# Patient Record
Sex: Female | Born: 1965 | Race: White | Hispanic: No | Marital: Single | Smoking: Never smoker
Health system: Southern US, Community
[De-identification: ages and names within clinical notes are randomized; demographics above are authoritative.]

## PROBLEM LIST (undated history)

## (undated) DIAGNOSIS — F419 Anxiety disorder, unspecified: Secondary | ICD-10-CM

## (undated) DIAGNOSIS — E785 Hyperlipidemia, unspecified: Secondary | ICD-10-CM

## (undated) DIAGNOSIS — E119 Type 2 diabetes mellitus without complications: Secondary | ICD-10-CM

## (undated) DIAGNOSIS — G4730 Sleep apnea, unspecified: Secondary | ICD-10-CM

## (undated) DIAGNOSIS — J309 Allergic rhinitis, unspecified: Secondary | ICD-10-CM

## (undated) HISTORY — DX: Anxiety disorder, unspecified: F41.9

## (undated) HISTORY — DX: Sleep apnea, unspecified: G47.30

## (undated) HISTORY — DX: Allergic rhinitis, unspecified: J30.9

## (undated) HISTORY — DX: Type 2 diabetes mellitus without complications: E11.9

## (undated) HISTORY — DX: Hyperlipidemia, unspecified: E78.5

---

## 2006-11-19 ENCOUNTER — Ambulatory Visit: Payer: Self-pay | Admitting: Family Medicine

## 2009-01-05 ENCOUNTER — Emergency Department: Payer: Self-pay | Admitting: Emergency Medicine

## 2009-08-12 ENCOUNTER — Ambulatory Visit: Payer: Self-pay | Admitting: Internal Medicine

## 2009-11-22 ENCOUNTER — Emergency Department: Payer: Self-pay | Admitting: Emergency Medicine

## 2009-12-01 ENCOUNTER — Ambulatory Visit: Payer: Self-pay | Admitting: Internal Medicine

## 2010-04-19 ENCOUNTER — Ambulatory Visit: Payer: Self-pay | Admitting: Internal Medicine

## 2014-03-18 ENCOUNTER — Ambulatory Visit: Payer: Self-pay

## 2014-05-14 ENCOUNTER — Emergency Department: Payer: Self-pay | Admitting: Emergency Medicine

## 2014-05-14 LAB — CBC
HCT: 41.7 % (ref 35.0–47.0)
HGB: 13.6 g/dL (ref 12.0–16.0)
MCH: 31 pg (ref 26.0–34.0)
MCHC: 32.7 g/dL (ref 32.0–36.0)
MCV: 95 fL (ref 80–100)
PLATELETS: 226 10*3/uL (ref 150–440)
RBC: 4.39 10*6/uL (ref 3.80–5.20)
RDW: 12.6 % (ref 11.5–14.5)
WBC: 8.2 10*3/uL (ref 3.6–11.0)

## 2014-05-14 LAB — BASIC METABOLIC PANEL
ANION GAP: 10 (ref 7–16)
BUN: 15 mg/dL (ref 7–18)
CREATININE: 0.91 mg/dL (ref 0.60–1.30)
Calcium, Total: 8.7 mg/dL (ref 8.5–10.1)
Chloride: 107 mmol/L (ref 98–107)
Co2: 24 mmol/L (ref 21–32)
EGFR (African American): >60
Glucose: 90 mg/dL (ref 65–99)
Osmolality: 282 (ref 275–301)
POTASSIUM: 3.7 mmol/L (ref 3.5–5.1)
SODIUM: 141 mmol/L (ref 136–145)

## 2014-05-14 LAB — TROPONIN I

## 2014-10-01 ENCOUNTER — Ambulatory Visit: Payer: Self-pay | Admitting: Physician Assistant

## 2016-02-21 ENCOUNTER — Other Ambulatory Visit: Payer: Self-pay | Admitting: Nurse Practitioner

## 2016-02-21 DIAGNOSIS — Z1231 Encounter for screening mammogram for malignant neoplasm of breast: Secondary | ICD-10-CM

## 2016-03-07 ENCOUNTER — Ambulatory Visit: Payer: Self-pay

## 2016-03-28 ENCOUNTER — Other Ambulatory Visit: Payer: Self-pay | Admitting: Nurse Practitioner

## 2016-03-28 ENCOUNTER — Ambulatory Visit
Admission: RE | Admit: 2016-03-28 | Discharge: 2016-03-28 | Disposition: A | Discharge status: Home / Self Care | Payer: BC Managed Care – PPO | Source: Ambulatory Visit | Attending: Nurse Practitioner | Admitting: Nurse Practitioner

## 2016-03-28 DIAGNOSIS — Z1231 Encounter for screening mammogram for malignant neoplasm of breast: Secondary | ICD-10-CM | POA: Diagnosis not present

## 2016-05-03 ENCOUNTER — Ambulatory Visit: Payer: BC Managed Care – PPO | Attending: Nurse Practitioner | Admitting: Physical Therapy

## 2016-05-03 ENCOUNTER — Encounter: Payer: Self-pay | Admitting: Physical Therapy

## 2016-05-03 DIAGNOSIS — M62830 Muscle spasm of back: Secondary | ICD-10-CM | POA: Diagnosis present

## 2016-05-03 DIAGNOSIS — R293 Abnormal posture: Secondary | ICD-10-CM | POA: Diagnosis not present

## 2016-05-03 DIAGNOSIS — M5441 Lumbago with sciatica, right side: Secondary | ICD-10-CM | POA: Insufficient documentation

## 2016-05-03 NOTE — Therapy (Signed)
[facility] [address] [place], [place], [address] Phone: [phone]   Fax:  [phone]  Physical Therapy Evaluation  Patient Details  Name: Rebecca Henry MRN: [id number] Date of Birth: Dec 24, 1965 Referring Provider: Leretha Pol PA  Encounter Date: 05/03/2016      PT End of Session - 05/03/16 1810    Visit Number 1   Number of Visits 8   Date for PT Re-Evaluation 05/30/16   PT Start Time 1030   PT Stop Time 1119   PT Time Calculation (min) 49 min   Activity Tolerance Patient tolerated treatment well;Patient limited by pain   Behavior During Therapy [facility] for tasks assessed/performed      History reviewed. No pertinent past medical history.  History reviewed. No pertinent surgical history.  There were no vitals filed for this visit.       Subjective Assessment - 05/03/16 1804    Subjective Pt states that she has a chronic history of low back pain starting in 2003 after she gave birth to her daughter. Reports that the pain didn't get too bad until spring of this year. She saw a chiropractor and used electrostim on her back which along with 1 month rest period gave her relief from low back pain symptoms. 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 Her cafeteria job requires constant standing, regular sweeping, lifting boxes and washing dishes. She is currently out of work secondary to low back pain until October 9th. In order to return to work she must be able to perform the following exercises without pain: lift 10-50 lbs, stoop and bend, push or pull carts with load weight 50-100 lbs, overhead reaching to retrieve case weighing 10-30lbs. Pt denies N/T or issues with bowel/bladder.   Pertinent History Pt with chronic hx of LBP starting iin 2003. Latest episode  involves aggravation of LBP/leg pain with lifting/bending/forward leaning tasks with onset at 9/22. Pt currently out of work with plans to return to work Oct 9th if sx are resolved.   Diagnostic tests x-ray at chiropractor; pt reports she was told there was "limited space/compression" in lower lumbar region.   Patient Stated Goals pt would like to be pain free in her back and legs so that she can return to work.   Currently in Pain? Yes   Pain Score 3    Pain Location Back   Pain Orientation Lower   Pain Descriptors / Indicators Constant   Pain Onset 1 to 4 weeks ago   Aggravating Factors  lifting, bending, forward reaching            [facility] PT Assessment - 05/03/16 0001      Assessment   Medical Diagnosis Low Back Pain   Referring Provider Leretha Pol PA   Onset Date/Surgical Date 04/27/16   Prior Therapy no PT; prior chiropractic care for current episode      Objective:  Manual tx: MET of right piriformis in hooklying 5 sec contract/relax with increased hip ER following tx.   Therapeutic Exercise: Seated piriformis stretch 1 minute x2 ea. Hooklying piriformis stretch - pt with difficulty obtaining/sustaining required position. Cat/camel 10 sec holds x5. Lumbar flexion with white theraball x5 with L opening. Lumbar lateral flexion L opening.   Pt response for medical necessity: Pt presents  with chronic history of low back pain exacerbated by work demands including lifting, bending, forward reaching, etc. She demonstrates functional mobility but shows preference for anterior pelvic tilt with onset of LBP associated with posterior tilt. She experiences 1 episode of acute LBP during PT eval with ER of L hip; pt demonstrates moderate distress - symptoms resolve within 1 minute and she is able to resume evaluation. Pt shows better tolerance of self stretch for L piriformis in sitting.        PT Education - 05/03/16 1810    Education provided Yes   Education Details HEP: piriformis  stretch, cat camel, lateral flexion on theraball, lumbar flexion in sitting with theraball   Person(s) Educated Patient   Methods Explanation;Demonstration;Handout   Comprehension Verbalized understanding;Returned demonstration             PT Long Term Goals - 05/03/16 1826      PT LONG TERM GOAL #1   Title Pt will score <25% on Modified Oswestry to promote increase in self percieved functional mobility so she can tolerate work demands   Baseline 05/03/16: 36%   Time 4   Period Weeks   Status New     PT LONG TERM GOAL #2   Title Pt will perform functional lift with >25lbs with <2/10 pain to promote tolerance of work related tasks   Baseline pt is reluctant to perform lifting activities secondary to onset of LBP/LE pain   Time 4   Period Weeks   Status New     PT LONG TERM GOAL #3   Title Pt perform lumbar lateral flexion with no onset of L sided pain to promote painfree ROM in all planes so that she can perform required work duties   Baseline pt experiences compressive pain with L lateral flexion.    Time 4   Period Weeks   Status New     PT LONG TERM GOAL #4   Title Pt will be independent with HEP/regular exercise program to promote general wellness and independent management of sx.   Baseline pt currently not exercising; fear avoidance behaviors to tasks requiring lumbar flexion   Time 4   Period Weeks   Status New     PT LONG TERM GOAL #5   Title Pt will be able to lift 10lb box from waist level to overhead with <2/10 LBP/leg pain so that she can lift juice trays at work   Baseline pt currently out of work/work duties due to high degree of low back pain   Time 4   Period Weeks   Status New               Plan - 05/03/16 1811    Clinical Impression Statement Pt is a pleasant 50 year old female referred to physical therapy due to onset of low back pain on 04/27/16. Pt experienced similar episode of back pain in spring of this year due to work demands including  lifting, bending and forward reaching. She had resolution of sx after 1 month period of rest but with resurgence in late September of this year. Pain is severe in intensity with and typically starts in left low back/buttock; but pt experiences sciatica down R leg. Pt with no pain reported at rest but exacerbation of sx with activities involving lumbar flexion/postural stability. Posture: pt with increased lumbar lordosis; anterior tilt is position of preference when performing cat/camel exercise. Pt position of comfort in hooklying is with hands propped at lordotic curve  to maintain anterior pelvic tilt. ROM: Pt with full range lumbar flexion and lumbar extension but with pain at end range. Pt with full range lateral flexion but onset of L pain with L lateral flexion. Pt with no c/o of pain with lumbar rotation but limited R rotation. Strength: MMT pt with grossly 5/5 strength in LE except hip flexion 4/5 bilat; onset of low back pain with resisted hip flexion. Hip ROM: pt with limited hip ER of LLE secondary to onset of low back pain. Special tests: Slump test (-) bilaterally, SLR (-); pt with good tolerance of straight leg raise as long as she maintains anterior pelvic tilt. Palpation: pt with moderate hypertonicity of thoracic/lumbar parasinals bilaterally with noted mild-moderate tenderness to palpation. Outcome Measures: Modified Oswestry: 36% Moderate self perceived disability. Pt will benefit from skilled physical therapy to increase pain-free mobility of lumbar spine, promote ideal body mechanics for functional tasks and promote return to work.   Rehab Potential Good   PT Frequency 2x / week   PT Duration 4 weeks   PT Treatment/Interventions ADLs/Self Care Home Management;Aquatic Therapy;Cryotherapy;Electrical Stimulation;Moist Heat;Functional mobility training;Therapeutic activities;Therapeutic exercise;Balance training;Neuromuscular re-education;Patient/family education;Manual techniques;Passive range  of motion;Dry needling   PT Next Visit Plan assess passive accessory of lumbar spine, STM, pain management with pain tolerant mobility exercises   PT Home Exercise Plan HEP program: seated piriformis stretch, cat/camel, lumbar lateral flexion, seated lumbar flexion stretch with theraball   Consulted and Agree with Plan of Care Patient      Patient will benefit from skilled therapeutic intervention in order to improve the following deficits and impairments:  Decreased activity tolerance, Decreased balance, Decreased endurance, Decreased mobility, Decreased range of motion, Decreased strength, Difficulty walking, Hypomobility, Increased muscle spasms, Impaired flexibility, Improper body mechanics, Postural dysfunction, Obesity, Pain  Visit Diagnosis: Abnormal posture  Muscle spasm of back  Bilateral low back pain with right-sided sciatica     Problem List There are no active problems to display for this patient.  Pura Spice, PT, DPT # [phone] Mickel Baas Regis Wiland SPT 05/03/2016, 6:31 PM   [facility] [address] [place], [place], [address] Phone: [phone]   Fax:  [phone]  Name: Rebecca Henry MRN: [id number] Date of Birth: 1965/11/08

## 2016-05-07 ENCOUNTER — Encounter: Payer: Self-pay | Admitting: Physical Therapy

## 2016-05-07 ENCOUNTER — Ambulatory Visit: Payer: BC Managed Care – PPO | Attending: Nurse Practitioner | Admitting: Physical Therapy

## 2016-05-07 DIAGNOSIS — G8929 Other chronic pain: Secondary | ICD-10-CM | POA: Diagnosis present

## 2016-05-07 DIAGNOSIS — R293 Abnormal posture: Secondary | ICD-10-CM | POA: Insufficient documentation

## 2016-05-07 DIAGNOSIS — M5441 Lumbago with sciatica, right side: Secondary | ICD-10-CM | POA: Insufficient documentation

## 2016-05-07 DIAGNOSIS — M62830 Muscle spasm of back: Secondary | ICD-10-CM | POA: Insufficient documentation

## 2016-05-07 NOTE — Therapy (Signed)
[facility] [address] [place], [place], [address] Phone: [phone]   Fax:  [phone]  Physical Therapy Treatment  Patient Details  Name: Rebecca Henry MRN: [id number] Date of Birth: 05/23/1966 Referring Provider: Vincent GrosHeather Boscia PA  Encounter Date: 05/07/2016      PT End of Session - 05/07/16 1239    Visit Number 2   Number of Visits 8   Date for PT Re-Evaluation 05/30/16   PT Start Time 1115   PT Stop Time 1201   PT Time Calculation (min) 46 min   Activity Tolerance Patient tolerated treatment well   Behavior During Therapy [facility] for tasks assessed/performed      History reviewed. No pertinent past medical history.  History reviewed. No pertinent surgical history.  There were no vitals filed for this visit.      Subjective Assessment - 05/07/16 1238    Subjective Pt states that she was confused about her home exercise program and did not feel that she was able to do it properly. Arrives to PT session with theraball to see if it is appropriate width to use for her exercises.   Pertinent History Pt with chronic hx of LBP starting iin 2003. Latest episode involves aggravation of LBP/leg pain with lifting/bending/forward leaning tasks with onset at 9/22. Pt currently out of work with plans to return to work Oct 9th if sx are resolved.   Diagnostic tests x-ray at chiropractor; pt reports she was told there was "limited space/compression" in lower lumbar region.   Patient Stated Goals pt would like to be pain free in her back and legs so that she can return to work.   Currently in Pain? Yes   Pain Score 3    Pain Location Back   Pain Orientation Lower   Pain Descriptors / Indicators Constant   Pain Type Chronic pain   Pain Onset 1 to 4 weeks ago      Objective:  Therapeutic Exercise: Review of HEP. Seated modified child's pose with green theraball x5. Seated piriformis stretch 30 sec LLE x2. Hip flexor stretch LLE 1  minute x1. Cat/camel x5. Standing lumbar lateral flexion stretch 30 sec x4.   Manual tx: L sacral mobilization (grade II-III, 4 bouts, 1 minute). R sacral mobilization (grade III, 3 bouts, 1 minute). UPA L TP of L4-L5 (grade II, 2 bouts, 1 minute). CPA L4, L5 (grade II, 2 bouts, 1 minute).   Pt response for medical necessity: Pt with initial hypersensitivity to L sacral mobilization; demonstrates ability to tolerate increased pressure with continued treatment. Pt with reported stiffness in low back upon standing from prone position during manual tx. Pt with tendency for increased pain/stiffness if she sustains one posture for >1 minute; better tolerance of lumbar mobility if she is able to mobilize in between repetitions.       PT Education - 05/07/16 1239    Education provided Yes   Education Details Review original HEP. Hip flexor stretch   Person(s) Educated Patient   Methods Explanation;Demonstration;Handout   Comprehension Verbalized understanding;Returned demonstration             PT Long Term Goals - 05/03/16 1826      PT LONG TERM GOAL #1   Title Pt will score <25% on Modified Oswestry to promote increase in self percieved functional mobility so she can tolerate work demands   Baseline 05/03/16: 36%   Time 4   Period Weeks   Status New  PT LONG TERM GOAL #2   Title Pt will perform functional lift with >25lbs with <2/10 pain to promote tolerance of work related tasks   Baseline pt is reluctant to perform lifting activities secondary to onset of LBP/LE pain   Time 4   Period Weeks   Status New     PT LONG TERM GOAL #3   Title Pt perform lumbar lateral flexion with no onset of L sided pain to promote painfree ROM in all planes so that she can perform required work duties   Baseline pt experiences compressive pain with L lateral flexion.    Time 4   Period Weeks   Status New     PT LONG TERM GOAL #4   Title Pt will be independent with HEP/regular exercise program  to promote general wellness and independent management of sx.   Baseline pt currently not exercising; fear avoidance behaviors to tasks requiring lumbar flexion   Time 4   Period Weeks   Status New     PT LONG TERM GOAL #5   Title Pt will be able to lift 10lb box from waist level to overhead with <2/10 LBP/leg pain so that she can lift juice trays at work   Baseline pt currently out of work/work duties due to high degree of low back pain   Time 4   Period Weeks   Status New            Plan - 05/07/16 1240    Clinical Impression Statement Pt demonstrates TTP of R/L lower lumbar region, hypomobility of sacrum bilaterally with decreased tolerance of pressure noted on L. Pt with radiating pain with CPA at L4-L5, UPA L L4-L5. Mild hypomobility of lumbar spine noted. Pt able to perform seated piriformis stretch today without acute exacerbation of pain this session.    Rehab Potential Good   PT Frequency 2x / week   PT Duration 4 weeks   PT Treatment/Interventions ADLs/Self Care Home Management;Aquatic Therapy;Cryotherapy;Electrical Stimulation;Moist Heat;Functional mobility training;Therapeutic activities;Therapeutic exercise;Balance training;Neuromuscular re-education;Patient/family education;Manual techniques;Passive range of motion;Dry needling   PT Next Visit Plan progression of mobility exercises, core/proximal strengthening within tolerance, functional movement   PT Home Exercise Plan HEP program: seated piriformis stretch, cat/camel, lumbar lateral flexion, seated lumbar flexion stretch with theraball   Consulted and Agree with Plan of Care Patient      Patient will benefit from skilled therapeutic intervention in order to improve the following deficits and impairments:  Decreased activity tolerance, Decreased balance, Decreased endurance, Decreased mobility, Decreased range of motion, Decreased strength, Difficulty walking, Hypomobility, Increased muscle spasms, Impaired flexibility,  Improper body mechanics, Postural dysfunction, Obesity, Pain  Visit Diagnosis: Abnormal posture  Muscle spasm of back  Chronic bilateral low back pain with right-sided sciatica     Problem List There are no active problems to display for this patient.  Cammie Mcgee, PT, DPT # [phone] Vernona Rieger Kaileen Bronkema SPT 05/07/2016, 12:44 PM  [facility] [address] [place], [place], [address] Phone: [phone]   Fax:  [phone]  Name: Rebecca Henry MRN: [id number] Date of Birth: 1966/01/20

## 2016-05-10 ENCOUNTER — Ambulatory Visit: Payer: BC Managed Care – PPO | Admitting: Physical Therapy

## 2016-05-10 ENCOUNTER — Encounter: Payer: Self-pay | Admitting: Physical Therapy

## 2016-05-10 DIAGNOSIS — R293 Abnormal posture: Secondary | ICD-10-CM | POA: Diagnosis not present

## 2016-05-10 DIAGNOSIS — M5441 Lumbago with sciatica, right side: Secondary | ICD-10-CM

## 2016-05-10 DIAGNOSIS — G8929 Other chronic pain: Secondary | ICD-10-CM

## 2016-05-10 DIAGNOSIS — M62830 Muscle spasm of back: Secondary | ICD-10-CM

## 2016-05-10 NOTE — Therapy (Signed)
[facility] [address] [place], [place], [address] Phone: [phone]   Fax:  [phone]  Physical Therapy Treatment  Patient Details  Name: Rebecca Henry MRN: [id number] Date of Birth: 31-Aug-1965 Referring Provider: Vincent Gros PA  Encounter Date: 05/10/2016      PT End of Session - 05/10/16 1730    Visit Number 3   Number of Visits 8   Date for PT Re-Evaluation 05/30/16   PT Start Time 1115   PT Stop Time 1203   PT Time Calculation (min) 48 min   Activity Tolerance Patient tolerated treatment well   Behavior During Therapy [facility] for tasks assessed/performed      History reviewed. No pertinent past medical history.  History reviewed. No pertinent surgical history.  There were no vitals filed for this visit.      Subjective Assessment - 05/10/16 1728    Subjective Pt states that she is planning to return to work on October 9th with no restrictions. States that she does not feel her back pain has resolved at this point and continues to be aggravated with lumbar flexion associated tasks.    Pertinent History Pt with chronic hx of LBP starting iin 2003. Latest episode involves aggravation of LBP/leg pain with lifting/bending/forward leaning tasks with onset at 9/22. Pt currently out of work with plans to return to work Oct 9th if sx are resolved.   Diagnostic tests x-ray at chiropractor; pt reports she was told there was "limited space/compression" in lower lumbar region.   Patient Stated Goals pt would like to be pain free in her back and legs so that she can return to work.   Currently in Pain? Yes   Pain Score 3    Pain Location Back   Pain Orientation Lower   Pain Descriptors / Indicators Constant   Pain Type Chronic pain   Pain Onset 1 to 4 weeks ago      Objective:  SciFit 8 minutes Level 6 (warm up/no charge)  Therapeutic Exercise: Session emphasis on deconstruction of work related tasks to promote  functional/pain free movement patterns: Lifting task with weighted box from waist level to end range shoulder flexion #20. Box lift from 6'' height to waist level. Lumbar ext/lateral flexion AROM/stretch with 30 sec hold. Squat x10 with emphasis on hip hinge and knee flexion for depth; decrease reliance on lumbar spine for mobility and power. Transfer of weighted box from 90 deg angle with emphasis on keeping hips square to weighted object.   Pt response for medical necessity: Pt notes overall decrease in low back pain when performing SciFit warm up secondary to lumbar support. Encouraged pt to consider purchase of lumbar brace to promote stability/pain limited work duty tolerance.       PT Education - 05/10/16 1730    Education provided Yes   Education Details Functional mobility; strategies for work   [employer]) Educated Patient   Methods Explanation;Demonstration;Handout   Comprehension Verbalized understanding;Returned demonstration             PT Long Term Goals - 05/03/16 1826      PT LONG TERM GOAL #1   Title Pt will score <25% on Modified Oswestry to promote increase in self percieved functional mobility so she can tolerate work demands   Baseline 05/03/16: 36%   Time 4   Period Weeks   Status New     PT LONG TERM GOAL #2   Title Pt will perform functional  lift with >25lbs with <2/10 pain to promote tolerance of work related tasks   Baseline pt is reluctant to perform lifting activities secondary to onset of LBP/LE pain   Time 4   Period Weeks   Status New     PT LONG TERM GOAL #3   Title Pt perform lumbar lateral flexion with no onset of L sided pain to promote painfree ROM in all planes so that she can perform required work duties   Baseline pt experiences compressive pain with L lateral flexion.    Time 4   Period Weeks   Status New     PT LONG TERM GOAL #4   Title Pt will be independent with HEP/regular exercise program to promote general wellness and  independent management of sx.   Baseline pt currently not exercising; fear avoidance behaviors to tasks requiring lumbar flexion   Time 4   Period Weeks   Status New     PT LONG TERM GOAL #5   Title Pt will be able to lift 10lb box from waist level to overhead with <2/10 LBP/leg pain so that she can lift juice trays at work   Baseline pt currently out of work/work duties due to high degree of low back pain   Time 4   Period Weeks   Status New            Plan - 05/10/16 1731    Clinical Impression Statement Pt demonstrates tendency for reliance on lumbar extensors during functional tasks. She is receptive to strategies to increase reliance on LE support/strength and decrease reliance on back extensors. Her low back pain sx are aggravated by positioning that involves sustained/weighted lumbar flexion. Pt with plans to return to work on Oct 9th with new strategies and lumbar brace to promote better tolerance of work duties.   Rehab Potential Good   PT Frequency 2x / week   PT Duration 4 weeks   PT Treatment/Interventions ADLs/Self Care Home Management;Aquatic Therapy;Cryotherapy;Electrical Stimulation;Moist Heat;Functional mobility training;Therapeutic activities;Therapeutic exercise;Balance training;Neuromuscular re-education;Patient/family education;Manual techniques;Passive range of motion;Dry needling   PT Next Visit Plan progression of mobility exercises, core/proximal strengthening within tolerance, functional movement   PT Home Exercise Plan HEP program: seated piriformis stretch, cat/camel, lumbar lateral flexion, seated lumbar flexion stretch with theraball   Consulted and Agree with Plan of Care Patient      Patient will benefit from skilled therapeutic intervention in order to improve the following deficits and impairments:  Decreased activity tolerance, Decreased balance, Decreased endurance, Decreased mobility, Decreased range of motion, Decreased strength, Difficulty  walking, Hypomobility, Increased muscle spasms, Impaired flexibility, Improper body mechanics, Postural dysfunction, Obesity, Pain  Visit Diagnosis: Abnormal posture  Muscle spasm of back  Chronic bilateral low back pain with right-sided sciatica     Problem List There are no active problems to display for this patient.  Cammie McgeeMichael C Sherk, PT, DPT # [phone] Vernona RiegerLaura Rovena Hearld SPT 05/10/2016, 5:36 PM  [place] [facility] [address] [place], [place], [address] Phone: [phone]   Fax:  [phone]  Name: Rebecca PandaKaren J Henry MRN: [id number] Date of Birth: [birth year]

## 2016-05-16 ENCOUNTER — Ambulatory Visit: Payer: BC Managed Care – PPO | Admitting: Physical Therapy

## 2016-05-16 ENCOUNTER — Encounter: Payer: Self-pay | Admitting: Physical Therapy

## 2016-05-16 DIAGNOSIS — M5441 Lumbago with sciatica, right side: Secondary | ICD-10-CM

## 2016-05-16 DIAGNOSIS — R293 Abnormal posture: Secondary | ICD-10-CM

## 2016-05-16 DIAGNOSIS — G8929 Other chronic pain: Secondary | ICD-10-CM

## 2016-05-16 DIAGNOSIS — M62830 Muscle spasm of back: Secondary | ICD-10-CM

## 2016-05-16 NOTE — Therapy (Signed)
[facility] [address] [place], [place], [address] Phone: [phone]   Fax:  [phone]  Physical Therapy Treatment  Patient Details  Name: Rebecca PandaKaren J Weare MRN: [id number] Date of Birth: Dec 20, 1965 Referring Provider: Vincent GrosHeather Boscia PA  Encounter Date: 05/16/2016      PT End of Session - 05/16/16 1700    Visit Number 4   Number of Visits 8   Date for PT Re-Evaluation 05/30/16   PT Start Time 1445   PT Stop Time 1533   PT Time Calculation (min) 48 min   Activity Tolerance Patient tolerated treatment well   Behavior During [facility] for tasks assessed/performed      History reviewed. No pertinent past medical history.  History reviewed. No pertinent surgical history.  There were no vitals filed for this visit.      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 She is modifying all other work duties with strategies discussed in previous PT session with good success. Pt notes that she is able to achieve greater hip ROM when performing piriformis stretch.    Pertinent History Pt with chronic hx of LBP starting iin 2003. Latest episode involves aggravation of LBP/leg pain with lifting/bending/forward leaning tasks with onset at 9/22. Pt currently out of work with plans to return to work Oct 9th if sx are resolved.   Diagnostic tests x-ray at chiropractor; pt reports she was told there was "limited space/compression" in lower lumbar region.   Patient Stated Goals pt would like to be pain free in her back and legs so that she can return to work.   Currently in Pain? No/denies      Objective:  Therapeutic Exercise: Stand<>Tall kneeling with UE assist x5 as functional position for work. Partial lunge R/L x10; pt with tendency for pelvic rotation and difficulty maintaining balance. Partial  lunge on 6'' step in // bars: pt demonstrates better tolerance of lunge task but experiences discomfort in low back with exercise. Wall squat with 5 sec holds x10; cueing to maintain neutral spine. TrA activation in hooklying: 10 sec holds x8. Pt requiring practice and cueing to perform without breath holding. Pelvic tilt with TrA activation x8; verbal cueing to flatten back against table.   Pt response for medical necessity: Pt with overall good tolerance of therapeutic exercise this session. She has onset of mild discomfort with lunge task but pain abates with resumption of neutral posture. Pt able to tolerate hooklying position with no increase in LBP as compared to initial visit. Pt will benefit from physical therapy to promote functional posture/strategies for work tasks and strengthening of core/proximal musculature for long term pain free mobility.      PT Education - 05/16/16 1659    Education provided Yes   Education Details see pt instructions for details: gastroc/soleus stretch, wall squat, tall kneeling as functional position for work task. core stability program issued: TrA activation and pelvic tilt   Person(s) Educated Patient   Methods Explanation;Demonstration;Handout   Comprehension Verbalized understanding;Returned demonstration             PT Long Term Goals - 05/03/16 1826      PT LONG TERM GOAL #1   Title Pt will score <25% on Modified Oswestry to promote increase in self percieved functional mobility so she can tolerate work  demands   Baseline 05/03/16: 36%   Time 4   Period Weeks   Status New     PT LONG TERM GOAL #2   Title Pt will perform functional lift with >25lbs with <2/10 pain to promote tolerance of work related tasks   Baseline pt is reluctant to perform lifting activities secondary to onset of LBP/LE pain   Time 4   Period Weeks   Status New     PT LONG TERM GOAL #3   Title Pt perform lumbar lateral flexion with no onset of L sided pain to promote  painfree ROM in all planes so that she can perform required work duties   Baseline pt experiences compressive pain with L lateral flexion.    Time 4   Period Weeks   Status New     PT LONG TERM GOAL #4   Title Pt will be independent with HEP/regular exercise program to promote general wellness and independent management of sx.   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 She is able to assume tall kneeling position for functional tasks but demonstrates difficulty with full or modified lunge. Pt with good tolerance of wall squat with cueing to keep lumbar/thoracic spine flat against wall. Pt with mild difficulty performing TrA activation; benefits from practice with repeated cueing. Pt will benefit from PT services to address postural/biomechanical strategies for successful performance of work duties, and promote LE/core strength for longterm pain free mobility.   Rehab Potential Good   PT Frequency 2x / week   PT Duration 4 weeks   PT Treatment/Interventions ADLs/Self Care Home Management;Aquatic Therapy;Cryotherapy;Electrical Stimulation;Moist Heat;Functional mobility training;Therapeutic activities;Therapeutic exercise;Balance training;Neuromuscular re-education;Patient/family education;Manual techniques;Passive range of motion;Dry needling   PT Next Visit Plan core/proximal muscle strengthening;  assess/address thoracic spine   PT Home Exercise Plan HEP program: seated piriformis stretch, cat/camel, lumbar lateral flexion, seated lumbar flexion stretch with theraball   Consulted and Agree with Plan of Care Patient      Patient will benefit from skilled therapeutic intervention in order to improve the following deficits and impairments:  Decreased activity tolerance, Decreased balance, Decreased endurance, Decreased mobility, Decreased range of motion, Decreased strength, Difficulty walking, Hypomobility, Increased muscle spasms, Impaired flexibility, Improper body mechanics, Postural dysfunction, Obesity, Pain  Visit Diagnosis: Abnormal posture  Muscle spasm of back  Chronic bilateral low back pain with right-sided sciatica     Problem List There are no active problems to display for this patient.  Cammie Mcgee, PT, DPT # [phone] Vernona Rieger Dainel Arcidiacono SPT 05/16/2016, 5:07 PM   [facility] [address] [place], [place], [address] Phone: [phone]   Fax:  [phone]  Name: ALIVIANA BURDELL MRN: [id number] Date of Birth: [birth year]

## 2016-05-24 ENCOUNTER — Encounter: Payer: BC Managed Care – PPO | Admitting: Physical Therapy

## 2016-10-01 ENCOUNTER — Ambulatory Visit: Payer: Self-pay | Admitting: Physician Assistant

## 2016-10-24 ENCOUNTER — Ambulatory Visit: Payer: Self-pay | Admitting: Physician Assistant

## 2016-10-24 VITALS — BP 131/73 | HR 76 | Temp 98.3°F

## 2016-10-24 DIAGNOSIS — R5383 Other fatigue: Principal | ICD-10-CM

## 2016-10-24 DIAGNOSIS — R5381 Other malaise: Secondary | ICD-10-CM

## 2016-10-24 LAB — POCT INFLUENZA A/B
INFLUENZA A, POC: NEGATIVE
INFLUENZA B, POC: NEGATIVE

## 2016-10-24 NOTE — Progress Notes (Signed)
S:  Fatigue and body aches.  Exposed to flu.  No know fever.  No N, V, or D.   O:  TM's dull, throat with min. Drainage, neck supple without aden.  Lungs Clear bilat Heart RRR A: Viral illness P: flu negative.  Pt to take tylenol or ibu, increase fluids

## 2016-12-11 ENCOUNTER — Encounter: Payer: Self-pay | Admitting: Physician Assistant

## 2016-12-11 ENCOUNTER — Ambulatory Visit: Payer: Self-pay | Admitting: Physician Assistant

## 2016-12-11 VITALS — BP 140/90 | HR 78 | Temp 98.5°F | Ht 62.0 in | Wt 257.0 lb

## 2016-12-11 DIAGNOSIS — Z0000 Encounter for general adult medical examination without abnormal findings: Secondary | ICD-10-CM

## 2016-12-11 NOTE — Progress Notes (Signed)
S: pt here for wellness physical, had biometrics done at work for insurance purposes, no complaints other than concerned about her wieght; has put on 15-20lbs since she started this job in Jan, is eating out more, not exercising and is always tired, does have a pcp, saw her about a month ago for back pain, unsure of when her last physical was ros neg. PMH:  htn, anxiety, obesity  Social: nonsmoker  Fam: mother has htn, father's hx is unkown, 1/2 sister has drug abuse problems, mgf, died of lung ca at 763, mgm died at 6063 of dmII, CAD, also had thyroid problems   O: vitals w mildly elevated bp,  nad, ENT wnl, neck supple no lymph, lungs c t a, cv rrr, abd soft nontender bs normal all 4 quads  A: wellness exam  P: recommend myfitpal app, weight watchers online, meal prep, prediabetes class at lifestyle, f/u with pcp as needed, here as needed

## 2017-08-09 ENCOUNTER — Other Ambulatory Visit: Payer: Self-pay | Admitting: Nurse Practitioner

## 2017-08-09 DIAGNOSIS — F411 Generalized anxiety disorder: Principal | ICD-10-CM

## 2017-08-09 DIAGNOSIS — F410 Panic disorder [episodic paroxysmal anxiety] without agoraphobia: Secondary | ICD-10-CM

## 2017-08-09 MED ORDER — ALPRAZOLAM 0.25 MG PO TABS: 0.2500 mg | ORAL_TABLET | Freq: Every evening | ORAL | 2 refills | Status: DC | PRN

## 2017-08-09 NOTE — Progress Notes (Signed)
Renewed alprazolam 0.25mg  qd prn #30 with 2 refills

## 2017-09-10 ENCOUNTER — Ambulatory Visit: Payer: Self-pay | Admitting: Family Medicine

## 2017-09-10 VITALS — BP 140/80 | HR 101 | Temp 101.3°F | Resp 18

## 2017-09-10 DIAGNOSIS — R6889 Other general symptoms and signs: Secondary | ICD-10-CM

## 2017-09-10 DIAGNOSIS — R05 Cough: Secondary | ICD-10-CM

## 2017-09-10 DIAGNOSIS — R300 Dysuria: Secondary | ICD-10-CM

## 2017-09-10 DIAGNOSIS — R509 Fever, unspecified: Secondary | ICD-10-CM

## 2017-09-10 DIAGNOSIS — R059 Cough, unspecified: Secondary | ICD-10-CM

## 2017-09-10 DIAGNOSIS — J111 Influenza due to unidentified influenza virus with other respiratory manifestations: Secondary | ICD-10-CM

## 2017-09-10 LAB — POCT URINALYSIS DIPSTICK
Bilirubin, UA: NEGATIVE
Glucose, UA: NEGATIVE
Ketones, UA: NEGATIVE
LEUKOCYTES UA: NEGATIVE
Nitrite, UA: NEGATIVE
PH UA: 6.5 (ref 5.0–8.0)
PROTEIN UA: NEGATIVE
RBC UA: NEGATIVE
Spec Grav, UA: 1.025 (ref 1.010–1.025)
UROBILINOGEN UA: 0.2 U/dL

## 2017-09-10 LAB — POCT INFLUENZA A/B
INFLUENZA A, POC: NEGATIVE
INFLUENZA B, POC: NEGATIVE

## 2017-09-10 MED ORDER — BENZONATATE 100 MG PO CAPS: 100.0000 mg | ORAL_CAPSULE | Freq: Three times a day (TID) | ORAL | 0 refills | Status: DC | PRN

## 2017-09-10 MED ORDER — HYDROCODONE-HOMATROPINE 5-1.5 MG/5ML PO SYRP: ORAL_SOLUTION | ORAL | 0 refills | Status: DC

## 2017-09-10 MED ORDER — OSELTAMIVIR PHOSPHATE 75 MG PO CAPS: 75.0000 mg | ORAL_CAPSULE | Freq: Two times a day (BID) | ORAL | 0 refills | Status: DC

## 2017-09-10 NOTE — Progress Notes (Signed)
Patient ID: Rebecca Henry, female    DOB: 1965/08/11  Age: 52 y.o. MRN: [id number]  Chief Complaint  Patient presents with  . URI    Subjective:   Patient started getting sick yesterday.  She said chills body ache back pain.  When she takes of breath she coughs.  She cannot get comfortable because she coughs.  She has had some rhinorrhea but mostly is been the cough.  She does not smoke.  She did not get a flu shot, has never had the flu.  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  She has had some dysuria over the past week.  Current allergies, medications, problem list, past/family and social histories reviewed.  Objective:  BP 140/80   Pulse (!) 101   Temp (!) 101.3 F (38.5 C) (Oral)   Resp 18   SpO2 96%   Looks sick.  TMs normal.  Eyes injected bilaterally.  Throat mildly erythematous without exudate.  Neck supple without significant nodes.  Chest is clear to auscultation.  Heart regular without murmur.  Has the cough triggered when she takes deep breaths.  She is leaning over holding on she feels so bad right now.  Influenza test is negative.  Urinalysis is negative.  Assessment & Plan:   Assessment: 1. Fever, unspecified   2. Flu-like symptoms   3. Cough   4. Dysuria   5. Influenza       Plan: Explained to the patient that the flu test is not perfect.  She certainly looks and acts totally like someone with the influenza and I am going to treat her since she is in the window of benefit.  It could be a non-flu virus, but I suspect that it still is a influenza.  Orders Placed This Encounter  Procedures  . POCT Urinalysis Dipstick (CPT 81002)  . POCT Influenza A/B (POC66)    Meds ordered this encounter  Medications  . oseltamivir (TAMIFLU) 75 MG capsule    Sig: Take 1 capsule (75 mg total) by mouth 2 (two) times daily.    Dispense:  10 capsule    Refill:  0  . HYDROcodone-homatropine (HYCODAN) 5-1.5 MG/5ML syrup    Sig: Take 5 mL (1 teaspoon) every 4-6 hours  as needed for back cough    Dispense:  120 mL    Refill:  0  . benzonatate (TESSALON) 100 MG capsule    Sig: Take 1-2 capsules (100-200 mg total) by mouth 3 (three) times daily as needed for cough.    Dispense:  30 capsule    Refill:  0         Patient Instructions  Everything points to this being the flu, even though your test was negative.  I would say you have about a three fourths chance that this is a flu though it could be a flulike virus.  Tamiflu 1 twice daily for 5 days, this is an antiviral for influenza.  Drink plenty of fluids and get enough rest  Tylenol ibuprofen for fever and body aches  Plain Mucinex if needed to thin secretions  Hycodan cough syrup 1 teaspoon every 4-6 hours as needed for cough.  This will cause drowsiness.  When you get going back to work you can take the benzonatate cough pills 1 or 2 pills 3 times daily as needed for daytime cough.  This does not work as well as the cough syrup, but it will help calm the cough some without causing you to be  drowsy.  You can take an over the counter antihistamine decongestant such as Claritin-D if necessary for congestion.  In the event you are getting worse get rechecked.  Your urine test was normal.  Drinking lots of fluids will help your bladder.   Influenza, Adult Influenza, more commonly known as "the flu," is a viral infection that primarily affects the respiratory tract. The respiratory tract includes organs that help you breathe, such as the lungs, nose, and throat. The flu causes many common cold symptoms, as well as a high fever and body aches. The flu spreads easily from person to person (is contagious). Getting a flu shot (influenza vaccination) every year is the best way to prevent influenza. What are the causes? Influenza is caused by a virus. You can catch the virus by:  Breathing in droplets from an infected person's cough or sneeze.  Touching something that was recently contaminated with  the virus and then touching your mouth, nose, or eyes.  What increases the risk? The following factors may make you more likely to get the flu:  Not cleaning your hands frequently with soap and water or alcohol-based hand sanitizer.  Having close contact with many people during cold and flu season.  Touching your mouth, eyes, or nose without washing or sanitizing your hands first.  Not drinking enough fluids or not eating a healthy diet.  Not getting enough sleep or exercise.  Being under a high amount of stress.  Not getting a yearly (annual) flu shot.  You may be at a higher risk of complications from the flu, such as a severe lung infection (pneumonia), if you:  Are over the age of 50.  Are pregnant.  Have a weakened disease-fighting system (immune system). You may have a weakened immune system if you: ? Have HIV or AIDS. ? Are undergoing chemotherapy. ? Aretaking medicines that reduce the activity of (suppress) the immune system.  Have a long-term (chronic) illness, such as heart disease, kidney disease, diabetes, or lung disease.  Have a liver disorder.  Are obese.  Have anemia.  What are the signs or symptoms? Symptoms of this condition typically last 4-10 days and may include:  Fever.  Chills.  Headache, body aches, or muscle aches.  Sore throat.  Cough.  Runny or congested nose.  Chest discomfort and cough.  Poor appetite.  Weakness or tiredness (fatigue).  Dizziness.  Nausea or vomiting.  How is this diagnosed? This condition may be diagnosed based on your medical history and a physical exam. Your health care provider may do a nose or throat swab test to confirm the diagnosis. How is this treated? If influenza is detected early, you can be treated with antiviral medicine that can reduce the length of your illness and the severity of your symptoms. This medicine may be given by mouth (orally) or through an IV tube that is inserted in one of  your veins. The goal of treatment is to relieve symptoms by taking care of yourself at home. This may include taking over-the-counter medicines, drinking plenty of fluids, and adding humidity to the air in your home. In some cases, influenza goes away on its own. Severe influenza or complications from influenza may be treated in a hospital. Follow these instructions at home:  Take over-the-counter and prescription medicines only as told by your health care provider.  Use a cool mist humidifier to add humidity to the air in your home. This can make breathing easier.  Rest as needed.  Drink  enough fluid to keep your urine clear or pale yellow.  Cover your mouth and nose when you cough or sneeze.  Wash your hands with soap and water often, especially after you cough or sneeze. If soap and water are not available, use hand sanitizer.  Stay home from work or school as told by your health care provider. Unless you are visiting your health care provider, try to avoid leaving home until your fever has been gone for 24 hours without the use of medicine.  Keep all follow-up visits as told by your health care provider. This is important. How is this prevented?  Getting an annual flu shot is the best way to avoid getting the flu. You may get the flu shot in late summer, fall, or winter. Ask your health care provider when you should get your flu shot.  Wash your hands often or use hand sanitizer often.  Avoid contact with people who are sick during cold and flu season.  Eat a healthy diet, drink plenty of fluids, get enough sleep, and exercise regularly. Contact a health care provider if:  You develop new symptoms.  You have: ? Chest pain. ? Diarrhea. ? A fever.  Your cough gets worse.  You produce more mucus.  You feel nauseous or you vomit. Get help right away if:  You develop shortness of breath or difficulty breathing.  Your skin or nails turn a bluish color.  You have severe  pain or stiffness in your neck.  You develop a sudden headache or sudden pain in your face or ear.  You cannot stop vomiting. This information is not intended to replace advice given to you by your health care provider. Make sure you discuss any questions you have with your health care provider. Document Released: 07/20/2000 Document Revised: 12/29/2015 Document Reviewed: 05/17/2015 Elsevier Interactive Patient Education  2017 [employer].     Return if symptoms worsen or fail to improve.   HOPPER,DAVID, MD 09/10/2017

## 2017-09-10 NOTE — Patient Instructions (Addendum)
Everything points to this being the flu, even though your test was negative.  I would say you have about a three fourths chance that this is a flu though it could be a flulike virus.  Tamiflu 1 twice daily for 5 days, this is an antiviral for influenza.  Drink plenty of fluids and get enough rest  Tylenol ibuprofen for fever and body aches  Plain Mucinex if needed to thin secretions  Hycodan cough syrup 1 teaspoon every 4-6 hours as needed for cough.  This will cause drowsiness.  When you get going back to work you can take the benzonatate cough pills 1 or 2 pills 3 times daily as needed for daytime cough.  This does not work as well as the cough syrup, but it will help calm the cough some without causing you to be drowsy.  You can take an over the counter antihistamine decongestant such as Claritin-D if necessary for congestion.  In the event you are getting worse get rechecked.  Your urine test was normal.  Drinking lots of fluids will help your bladder.   Influenza, Adult Influenza, more commonly known as "the flu," is a viral infection that primarily affects the respiratory tract. The respiratory tract includes organs that help you breathe, such as the lungs, nose, and throat. The flu causes many common cold symptoms, as well as a high fever and body aches. The flu spreads easily from person to person (is contagious). Getting a flu shot (influenza vaccination) every year is the best way to prevent influenza. What are the causes? Influenza is caused by a virus. You can catch the virus by:  Breathing in droplets from an infected person's cough or sneeze.  Touching something that was recently contaminated with the virus and then touching your mouth, nose, or eyes.  What increases the risk? The following factors may make you more likely to get the flu:  Not cleaning your hands frequently with soap and water or alcohol-based hand sanitizer.  Having close contact with many people  during cold and flu season.  Touching your mouth, eyes, or nose without washing or sanitizing your hands first.  Not drinking enough fluids or not eating a healthy diet.  Not getting enough sleep or exercise.  Being under a high amount of stress.  Not getting a yearly (annual) flu shot.  You may be at a higher risk of complications from the flu, such as a severe lung infection (pneumonia), if you:  Are over the age of 49.  Are pregnant.  Have a weakened disease-fighting system (immune system). You may have a weakened immune system if you: ? Have HIV or AIDS. ? Are undergoing chemotherapy. ? Aretaking medicines that reduce the activity of (suppress) the immune system.  Have a long-term (chronic) illness, such as heart disease, kidney disease, diabetes, or lung disease.  Have a liver disorder.  Are obese.  Have anemia.  What are the signs or symptoms? Symptoms of this condition typically last 4-10 days and may include:  Fever.  Chills.  Headache, body aches, or muscle aches.  Sore throat.  Cough.  Runny or congested nose.  Chest discomfort and cough.  Poor appetite.  Weakness or tiredness (fatigue).  Dizziness.  Nausea or vomiting.  How is this diagnosed? This condition may be diagnosed based on your medical history and a physical exam. Your health care provider may do a nose or throat swab test to confirm the diagnosis. How is this treated? If influenza is detected  early, you can be treated with antiviral medicine that can reduce the length of your illness and the severity of your symptoms. This medicine may be given by mouth (orally) or through an IV tube that is inserted in one of your veins. The goal of treatment is to relieve symptoms by taking care of yourself at home. This may include taking over-the-counter medicines, drinking plenty of fluids, and adding humidity to the air in your home. In some cases, influenza goes away on its own. Severe  influenza or complications from influenza may be treated in a hospital. Follow these instructions at home:  Take over-the-counter and prescription medicines only as told by your health care provider.  Use a cool mist humidifier to add humidity to the air in your home. This can make breathing easier.  Rest as needed.  Drink enough fluid to keep your urine clear or pale yellow.  Cover your mouth and nose when you cough or sneeze.  Wash your hands with soap and water often, especially after you cough or sneeze. If soap and water are not available, use hand sanitizer.  Stay home from work or school as told by your health care provider. Unless you are visiting your health care provider, try to avoid leaving home until your fever has been gone for 24 hours without the use of medicine.  Keep all follow-up visits as told by your health care provider. This is important. How is this prevented?  Getting an annual flu shot is the best way to avoid getting the flu. You may get the flu shot in late summer, fall, or winter. Ask your health care provider when you should get your flu shot.  Wash your hands often or use hand sanitizer often.  Avoid contact with people who are sick during cold and flu season.  Eat a healthy diet, drink plenty of fluids, get enough sleep, and exercise regularly. Contact a health care provider if:  You develop new symptoms.  You have: ? Chest pain. ? Diarrhea. ? A fever.  Your cough gets worse.  You produce more mucus.  You feel nauseous or you vomit. Get help right away if:  You develop shortness of breath or difficulty breathing.  Your skin or nails turn a bluish color.  You have severe pain or stiffness in your neck.  You develop a sudden headache or sudden pain in your face or ear.  You cannot stop vomiting. This information is not intended to replace advice given to you by your health care provider. Make sure you discuss any questions you have  with your health care provider. Document Released: 07/20/2000 Document Revised: 12/29/2015 Document Reviewed: 05/17/2015 Elsevier Interactive Patient Education  2017 [employer]

## 2017-09-30 ENCOUNTER — Encounter: Payer: Self-pay | Admitting: Nurse Practitioner

## 2017-09-30 ENCOUNTER — Ambulatory Visit: Payer: Managed Care, Other (non HMO) | Admitting: Nurse Practitioner

## 2017-09-30 VITALS — BP 128/88 | HR 94 | Temp 97.2°F | Resp 16 | Ht 62.0 in | Wt 264.0 lb

## 2017-09-30 DIAGNOSIS — M545 Low back pain, unspecified: Secondary | ICD-10-CM

## 2017-09-30 DIAGNOSIS — F410 Panic disorder [episodic paroxysmal anxiety] without agoraphobia: Secondary | ICD-10-CM

## 2017-09-30 DIAGNOSIS — F411 Generalized anxiety disorder: Secondary | ICD-10-CM | POA: Diagnosis not present

## 2017-09-30 DIAGNOSIS — I10 Essential (primary) hypertension: Secondary | ICD-10-CM | POA: Diagnosis not present

## 2017-09-30 MED ORDER — MELOXICAM 15 MG PO TABS: 15.0000 mg | ORAL_TABLET | Freq: Every day | ORAL | 3 refills | Status: DC

## 2017-09-30 MED ORDER — ALPRAZOLAM 0.25 MG PO TABS: 0.2500 mg | ORAL_TABLET | Freq: Every evening | ORAL | 2 refills | Status: DC | PRN

## 2017-09-30 NOTE — Progress Notes (Signed)
[facility] [address] [place], [place] [address]  Internal MEDICINE  Office Visit Note  Patient Name: Rebecca Henry  [id number]  [id number]  Date of Service: 10/13/2017  Chief Complaint  Patient presents with  . Cough    pt went to the walk in clinic and they gave her tamiflu but she was not positive for flu and she finished     Cough  This is a new problem. The current episode started in the past 7 days. The problem has been gradually improving. The problem occurs every few minutes. The cough is non-productive. Associated symptoms include ear congestion, nasal congestion, postnasal drip, rhinorrhea and a sore throat. Pertinent negatives include no chest pain, chills, ear pain, eye redness, rash or shortness of breath. Nothing aggravates the symptoms. Treatments tried: tamiflu. The treatment provided moderate relief. Her past medical history is significant for environmental allergies.    Pt is here for routine follow up.    Current Medication: Outpatient Encounter Medications as of 09/30/2017  Medication Sig  . ALPRAZolam (XANAX) 0.25 MG tablet Take 1 tablet (0.25 mg total) by mouth at bedtime as needed for anxiety (weeknights after 6pm).  Marland Kitchen atenolol (TENORMIN) 25 MG tablet Take 12.5 mg by mouth daily.  . cetirizine (ZYRTEC) 5 MG tablet Take 5 mg by mouth daily.  . chlorpheniramine (CHLOR-TRIMETON) 4 MG tablet Take 4 mg by mouth daily.  . cyclobenzaprine (FLEXERIL) 5 MG tablet Take by mouth 2 (two) times daily as needed.   . loratadine (CLARITIN) 10 MG tablet Take 10 mg by mouth daily.  . [DISCONTINUED] ALPRAZolam (XANAX) 0.25 MG tablet Take 1 tablet (0.25 mg total) by mouth at bedtime as needed for anxiety (weeknights after 6pm).  . benzonatate (TESSALON) 100 MG capsule Take 1-2 capsules (100-200 mg total) by mouth 3 (three) times daily as needed for cough. (Patient not taking: Reported on 09/30/2017)  . HYDROcodone-homatropine (HYCODAN) 5-1.5 MG/5ML syrup Take 5 mL  (1 teaspoon) every 4-6 hours as needed for back cough (Patient not taking: Reported on 09/30/2017)  . meloxicam (MOBIC) 15 MG tablet Take 1 tablet (15 mg total) by mouth daily.  Marland Kitchen oseltamivir (TAMIFLU) 75 MG capsule Take 1 capsule (75 mg total) by mouth 2 (two) times daily. (Patient not taking: Reported on 09/30/2017)  . [DISCONTINUED] etodolac (LODINE) 200 MG capsule Take by mouth.   No facility-administered encounter medications on file as of 09/30/2017.     Surgical History: Past Surgical History:  Procedure Laterality Date  . CESAREAN SECTION      Medical History: Past Medical History:  Diagnosis Date  . Allergic rhinitis   . Anxiety   . Hyperlipidemia   . Sleep apnea     Family History: No family history on file.  Social History   Socioeconomic History  . Marital status: Single    Spouse name: Not on file  . Number of children: Not on file  . Years of education: Not on file  . Highest education level: Not on file  Social Needs  . Financial resource strain: Not on file  . Food insecurity - worry: Not on file  . Food insecurity - inability: Not on file  . Transportation needs - medical: Not on file  . Transportation needs - non-medical: Not on file  Occupational History  . Not on file  Tobacco Use  . Smoking status: Never Smoker  . Smokeless tobacco: Never Used  Substance and Sexual Activity  . Alcohol use: No  . Drug  use: No  . Sexual activity: Not on file  Other Topics Concern  . Not on file  Social History Narrative  . Not on file      Review of Systems  Constitutional: Negative for chills, fatigue and unexpected weight change.  HENT: Positive for congestion, postnasal drip, rhinorrhea, sore throat and voice change. Negative for ear pain and sneezing.   Eyes: Negative.  Negative for redness.  Respiratory: Positive for cough. Negative for chest tightness and shortness of breath.   Cardiovascular: Negative for chest pain and palpitations.   Gastrointestinal: Negative for abdominal pain, constipation, diarrhea, nausea and vomiting.  Endocrine: Negative for cold intolerance, heat intolerance, polydipsia, polyphagia and polyuria.  Genitourinary: Negative for dysuria and frequency.  Musculoskeletal: Negative for arthralgias, back pain, joint swelling and neck pain.  Skin: Negative for rash.  Allergic/Immunologic: Positive for environmental allergies.  Neurological: Negative.  Negative for tremors and numbness.  Hematological: Negative for adenopathy. Does not bruise/bleed easily.  Psychiatric/Behavioral: Negative for behavioral problems (Depression), sleep disturbance and suicidal ideas. The patient is nervous/anxious.     Today's Vitals   09/30/17 1612 09/30/17 1645  BP: (!) 147/91 128/88  Pulse: 94   Resp: 16   Temp: (!) 97.2 F (36.2 C)   SpO2: 97%   Weight: 264 lb (119.7 kg)   Height: 5\' 2"  (1.575 m)     Physical Exam  Constitutional: She is oriented to person, place, and time. She appears well-developed and well-nourished. No distress.  HENT:  Head: Normocephalic and atraumatic.  Mouth/Throat: Oropharynx is clear and moist. No oropharyngeal exudate.  Eyes: EOM are normal. Pupils are equal, round, and reactive to light.  Neck: Normal range of motion. Neck supple. No JVD present. No tracheal deviation present. No thyromegaly present.  Cardiovascular: Normal rate, regular rhythm and normal heart sounds. Exam reveals no gallop and no friction rub.  No murmur heard. Pulmonary/Chest: Effort normal and breath sounds normal. No respiratory distress. She has no wheezes. She has no rales. She exhibits no tenderness.  Mild, non-productive cough present.   Abdominal: Soft. Bowel sounds are normal. There is no tenderness.  Musculoskeletal: Normal range of motion.  Mild to moderate lower back pain which is worse with bending and twisting at the waits. Currently, no evidence of abnormality or deformity.  Lymphadenopathy:     She has no cervical adenopathy.  Neurological: She is alert and oriented to person, place, and time. No cranial nerve deficit.  Skin: Skin is warm and dry. She is not diaphoretic.  Psychiatric: She has a normal mood and affect. Her behavior is normal. Judgment and thought content normal.  Nursing note and vitals reviewed.    Assessment/Plan: 1. Low back pain at multiple sites Meloxicam 15mg  daily. Muscle relaxer may be used as needed and as prescribed. - meloxicam (MOBIC) 15 MG tablet; Take 1 tablet (15 mg total) by mouth daily.  Dispense: 30 tablet; Refill: 3  2. Generalized anxiety disorder with panic attacks May continue using alprazolam 0.25mg  as needed and as prescribed.  - ALPRAZolam (XANAX) 0.25 MG tablet; Take 1 tablet (0.25 mg total) by mouth at bedtime as needed for anxiety (weeknights after 6pm).  Dispense: 30 tablet; Refill: 2  3. Essential hypertension Generally stable. Continue atenolol as prescribed.   General Counseling: manal kreutzer understanding of the findings of todays visit and agrees with plan of treatment. I have discussed any further diagnostic evaluation that may be needed or ordered today. We also reviewed her medications today. she has  been encouraged to call the office with any questions or concerns that should arise related to todays visit.    Meds ordered this encounter  Medications  . ALPRAZolam (XANAX) 0.25 MG tablet    Sig: Take 1 tablet (0.25 mg total) by mouth at bedtime as needed for anxiety (weeknights after 6pm).    Dispense:  30 tablet    Refill:  2    Please hold for when patient needs fill. thanks    Order Specific Question:   Supervising Provider    Answer:   Lyndon CodeKHAN, FOZIA M [[id number]]  . meloxicam (MOBIC) 15 MG tablet    Sig: Take 1 tablet (15 mg total) by mouth daily.    Dispense:  30 tablet    Refill:  3    Please hold until patient ready for refills. thanks    Order Specific Question:   Supervising Provider    Answer:   Lyndon CodeKHAN, FOZIA M  [[id number]]    Time spent: 8515 Minutes          Dr Lyndon CodeFozia M Khan Internal medicine

## 2017-10-13 DIAGNOSIS — M545 Low back pain, unspecified: Secondary | ICD-10-CM | POA: Insufficient documentation

## 2017-10-13 DIAGNOSIS — F410 Panic disorder [episodic paroxysmal anxiety] without agoraphobia: Secondary | ICD-10-CM | POA: Insufficient documentation

## 2017-10-13 DIAGNOSIS — I10 Essential (primary) hypertension: Secondary | ICD-10-CM | POA: Insufficient documentation

## 2017-10-13 DIAGNOSIS — I152 Hypertension secondary to endocrine disorders: Secondary | ICD-10-CM | POA: Insufficient documentation

## 2017-10-13 DIAGNOSIS — F411 Generalized anxiety disorder: Secondary | ICD-10-CM

## 2017-10-15 ENCOUNTER — Encounter: Payer: Self-pay | Admitting: Nurse Practitioner

## 2017-10-15 ENCOUNTER — Ambulatory Visit: Payer: Managed Care, Other (non HMO) | Admitting: Nurse Practitioner

## 2017-10-15 VITALS — BP 140/80 | HR 78 | Resp 16 | Ht 62.0 in | Wt 262.0 lb

## 2017-10-15 DIAGNOSIS — R7301 Impaired fasting glucose: Secondary | ICD-10-CM | POA: Insufficient documentation

## 2017-10-15 DIAGNOSIS — I10 Essential (primary) hypertension: Secondary | ICD-10-CM

## 2017-10-15 DIAGNOSIS — R300 Dysuria: Secondary | ICD-10-CM | POA: Diagnosis not present

## 2017-10-15 DIAGNOSIS — N390 Urinary tract infection, site not specified: Secondary | ICD-10-CM | POA: Insufficient documentation

## 2017-10-15 LAB — POCT URINALYSIS DIPSTICK
Bilirubin, UA: NEGATIVE
Blood, UA: NEGATIVE
Glucose, UA: NEGATIVE
KETONES UA: NEGATIVE
NITRITE UA: NEGATIVE
PROTEIN UA: NEGATIVE
Spec Grav, UA: 1.01 (ref 1.010–1.025)
Urobilinogen, UA: NEGATIVE E.U./dL — AB
pH, UA: 6 (ref 5.0–8.0)

## 2017-10-15 NOTE — Progress Notes (Signed)
[facility] [address] [place], [place] [address]  Internal MEDICINE  Office Visit Note  Patient Name: Rebecca Henry  [id number]  [id number]  Date of Service: 11/03/2017  Chief Complaint  Patient presents with  . Urinary Tract Infection    started on antibiotics last week.     The patient is here for follow up for UTI. She was treated for UTI per urgent care. Initially started on cipro. Changed to augmentin last Wednesday. Provider who saw her for this was concerned that she had glucose in her urine. Did BMP and HgbA1c. Her blood sugar was elevated at 217 and her HgbA1c was elevated at 6.8. They recommended she be seen at PCP for further evaluation.     Pt is here for routine follow up.    Current Medication: Outpatient Encounter Medications as of 10/15/2017  Medication Sig  . ALPRAZolam (XANAX) 0.25 MG tablet Take 1 tablet (0.25 mg total) by mouth at bedtime as needed for anxiety (weeknights after 6pm).  Marland Kitchen atenolol (TENORMIN) 25 MG tablet Take 12.5 mg by mouth daily.  . benzonatate (TESSALON) 100 MG capsule Take 1-2 capsules (100-200 mg total) by mouth 3 (three) times daily as needed for cough. (Patient not taking: Reported on 09/30/2017)  . cetirizine (ZYRTEC) 5 MG tablet Take 5 mg by mouth daily.  . chlorpheniramine (CHLOR-TRIMETON) 4 MG tablet Take 4 mg by mouth daily.  . cyclobenzaprine (FLEXERIL) 5 MG tablet Take by mouth 2 (two) times daily as needed.   Marland Kitchen HYDROcodone-homatropine (HYCODAN) 5-1.5 MG/5ML syrup Take 5 mL (1 teaspoon) every 4-6 hours as needed for back cough (Patient not taking: Reported on 09/30/2017)  . loratadine (CLARITIN) 10 MG tablet Take 10 mg by mouth daily.  . meloxicam (MOBIC) 15 MG tablet Take 1 tablet (15 mg total) by mouth daily.  Marland Kitchen oseltamivir (TAMIFLU) 75 MG capsule Take 1 capsule (75 mg total) by mouth 2 (two) times daily. (Patient not taking: Reported on 09/30/2017)   No facility-administered encounter medications on file as of  10/15/2017.     Surgical History: Past Surgical History:  Procedure Laterality Date  . CESAREAN SECTION      Medical History: Past Medical History:  Diagnosis Date  . Allergic rhinitis   . Anxiety   . Hyperlipidemia   . Sleep apnea     Family History: No family history on file.  Social History   Socioeconomic History  . Marital status: Single    Spouse name: Not on file  . Number of children: Not on file  . Years of education: Not on file  . Highest education level: Not on file  Occupational History  . Not on file  Social Needs  . Financial resource strain: Not on file  . Food insecurity:    Worry: Not on file    Inability: Not on file  . Transportation needs:    Medical: Not on file    Non-medical: Not on file  Tobacco Use  . Smoking status: Never Smoker  . Smokeless tobacco: Never Used  Substance and Sexual Activity  . Alcohol use: No  . Drug use: No  . Sexual activity: Not on file  Lifestyle  . Physical activity:    Days per week: Not on file    Minutes per session: Not on file  . Stress: Not on file  Relationships  . Social connections:    Talks on phone: Not on file    Gets together: Not on file  Attends religious service: Not on file    Active member of club or organization: Not on file    Attends meetings of clubs or organizations: Not on file    Relationship status: Not on file  . Intimate partner violence:    Fear of current or ex partner: Not on file    Emotionally abused: Not on file    Physically abused: Not on file    Forced sexual activity: Not on file  Other Topics Concern  . Not on file  Social History Narrative  . Not on file      Review of Systems  Constitutional: Negative for chills, fatigue and unexpected weight change.  HENT: Positive for voice change. Negative for congestion, ear pain, postnasal drip, rhinorrhea, sneezing and sore throat.   Eyes: Negative.  Negative for redness.  Respiratory: Negative for cough, chest  tightness, shortness of breath and wheezing.   Cardiovascular: Negative for chest pain and palpitations.  Gastrointestinal: Negative for abdominal pain, constipation, diarrhea, nausea and vomiting.  Endocrine: Negative for cold intolerance, heat intolerance, polydipsia, polyphagia and polyuria.       Elevated blood sugar and glucose in the urine at Urgent Care visit last week.   Genitourinary: Positive for dysuria. Negative for frequency.       Improving since being on antibiotics.   Musculoskeletal: Positive for back pain. Negative for arthralgias, joint swelling and neck pain.  Skin: Negative for rash.  Allergic/Immunologic: Positive for environmental allergies.  Neurological: Negative.  Negative for tremors and numbness.  Hematological: Negative for adenopathy. Does not bruise/bleed easily.  Psychiatric/Behavioral: Negative for behavioral problems (Depression), sleep disturbance and suicidal ideas. The patient is nervous/anxious.     Today's Vitals   10/15/17 1506  BP: 140/80  Pulse: 78  Resp: 16  SpO2: 98%  Weight: 262 lb (118.8 kg)  Height: 5\' 2"  (1.575 m)    Physical Exam  Constitutional: She is oriented to person, place, and time. She appears well-developed and well-nourished. No distress.  HENT:  Head: Normocephalic and atraumatic.  Mouth/Throat: Oropharynx is clear and moist. No oropharyngeal exudate.  Eyes: Pupils are equal, round, and reactive to light. EOM are normal.  Neck: Normal range of motion. Neck supple. No JVD present. No tracheal deviation present. No thyromegaly present.  Cardiovascular: Normal rate, regular rhythm and normal heart sounds. Exam reveals no gallop and no friction rub.  No murmur heard. Pulmonary/Chest: Effort normal and breath sounds normal. No respiratory distress. She has no wheezes. She has no rales. She exhibits no tenderness.  Abdominal: Soft. Bowel sounds are normal. There is no tenderness.  Genitourinary:  Genitourinary Comments: Urine  sample is without abnormality, infection, or glucose today.  Musculoskeletal:  Mild/moderate lower back pain. This is more severe with bending and twisting at the waist. No visible or palpable abnormalities present today.   Lymphadenopathy:    She has no cervical adenopathy.  Neurological: She is alert and oriented to person, place, and time. No cranial nerve deficit.  Skin: Skin is warm and dry. She is not diaphoretic.  Psychiatric: She has a normal mood and affect. Her behavior is normal. Judgment and thought content normal.  Nursing note and vitals reviewed.   Assessment/Plan: 1. Dysuria - POCT Urinalysis Dipstick - negative for acute abnormalities today. continue to monitor.   2. Impaired fasting glucose Check labs with HgbA1c for further evaluation  3. Essential hypertension Stable. Continue bp medication as prescribed   General Counseling: Rebecca Henry verbalizes understanding of the findings of  todays visit and agrees with plan of treatment. I have discussed any further diagnostic evaluation that may be needed or ordered today. We also reviewed her medications today. she has been encouraged to call the office with any questions or concerns that should arise related to todays visit.  Hypertension Counseling:   The following hypertensive lifestyle modification were recommended and discussed:  1. Limiting alcohol intake to less than 1 oz/day of ethanol:(24 oz of beer or 8 oz of wine or 2 oz of 100-proof whiskey). 2. Take baby ASA 81 mg daily. 3. Importance of regular aerobic exercise and losing weight. 4. Reduce dietary saturated fat and cholesterol intake for overall cardiovascular health. 5. Maintaining adequate dietary potassium, calcium, and magnesium intake. 6. Regular monitoring of the blood pressure. 7. Reduce sodium intake to less than 100 mmol/day (less than 2.3 gm of sodium or less than 6 gm of sodium choride)   This patient was seen by Vincent Gros, FNP- C in Collaboration  with Dr Lyndon Code as a part of collaborative care agreement    Orders Placed This Encounter  Procedures  . POCT Urinalysis Dipstick      Time spent: 56 Minutes   Dr Lyndon Code Internal medicine

## 2017-12-12 ENCOUNTER — Encounter: Payer: Self-pay | Admitting: Nurse Practitioner

## 2017-12-12 ENCOUNTER — Ambulatory Visit (INDEPENDENT_AMBULATORY_CARE_PROVIDER_SITE_OTHER): Payer: Managed Care, Other (non HMO) | Admitting: Nurse Practitioner

## 2017-12-12 VITALS — BP 141/72 | HR 85 | Resp 16 | Ht 62.5 in | Wt 264.0 lb

## 2017-12-12 DIAGNOSIS — R300 Dysuria: Secondary | ICD-10-CM | POA: Diagnosis not present

## 2017-12-12 DIAGNOSIS — E559 Vitamin D deficiency, unspecified: Secondary | ICD-10-CM

## 2017-12-12 DIAGNOSIS — L209 Atopic dermatitis, unspecified: Secondary | ICD-10-CM | POA: Diagnosis not present

## 2017-12-12 DIAGNOSIS — I10 Essential (primary) hypertension: Secondary | ICD-10-CM

## 2017-12-12 DIAGNOSIS — Z124 Encounter for screening for malignant neoplasm of cervix: Secondary | ICD-10-CM | POA: Diagnosis not present

## 2017-12-12 DIAGNOSIS — Z0001 Encounter for general adult medical examination with abnormal findings: Secondary | ICD-10-CM

## 2017-12-12 NOTE — Progress Notes (Signed)
[facility] [address] [place], [place] [address]  Internal MEDICINE  Office Visit Note  Patient Name: Rebecca Henry  [id number]  [id number]  Date of Service: 01/05/2018   Pt is here for routine health maintenance examination  Chief Complaint  Patient presents with  . Rash    abdomen      Rash  This is a new problem. The current episode started 1 to 4 weeks ago. The problem has been waxing and waning since onset. The affected locations include the abdomen. The rash is characterized by dryness, itchiness and redness. It is unknown if there was an exposure to a precipitant. Past treatments include anti-itch cream. The treatment provided mild relief. Her past medical history is significant for eczema.    Current Medication: Outpatient Encounter Medications as of 12/12/2017  Medication Sig  . ALPRAZolam (XANAX) 0.25 MG tablet Take 1 tablet (0.25 mg total) by mouth at bedtime as needed for anxiety (weeknights after 6pm).  Marland Kitchen atenolol (TENORMIN) 25 MG tablet Take 12.5 mg by mouth daily.  . benzonatate (TESSALON) 100 MG capsule Take 1-2 capsules (100-200 mg total) by mouth 3 (three) times daily as needed for cough. (Patient not taking: Reported on 09/30/2017)  . cetirizine (ZYRTEC) 5 MG tablet Take 5 mg by mouth daily.  . chlorpheniramine (CHLOR-TRIMETON) 4 MG tablet Take 4 mg by mouth daily.  . cyclobenzaprine (FLEXERIL) 5 MG tablet Take by mouth 2 (two) times daily as needed.   . loratadine (CLARITIN) 10 MG tablet Take 10 mg by mouth daily.  . meloxicam (MOBIC) 15 MG tablet Take 1 tablet (15 mg total) by mouth daily.  Marland Kitchen oseltamivir (TAMIFLU) 75 MG capsule Take 1 capsule (75 mg total) by mouth 2 (two) times daily. (Patient not taking: Reported on 09/30/2017)  . [DISCONTINUED] HYDROcodone-homatropine (HYCODAN) 5-1.5 MG/5ML syrup Take 5 mL (1 teaspoon) every 4-6 hours as needed for back cough (Patient not taking: Reported on 09/30/2017)   No facility-administered encounter  medications on file as of 12/12/2017.     Surgical History: Past Surgical History:  Procedure Laterality Date  . CESAREAN SECTION      Medical History: Past Medical History:  Diagnosis Date  . Allergic rhinitis   . Anxiety   . Hyperlipidemia   . Sleep apnea     Family History: Family History  Problem Relation Age of Onset  . Hypertension Mother   . Diabetes Maternal Grandmother   . Hypothyroidism Maternal Grandmother       Review of Systems  Skin: Positive for rash.     Today's Vitals   12/12/17 1543  BP: (!) 141/72  Pulse: 85  Resp: 16  SpO2: 98%  Weight: 264 lb (119.7 kg)  Height: 5' 2.5" (1.588 m)    Physical Exam  Constitutional: She is oriented to person, place, and time. She appears well-developed and well-nourished. No distress.  HENT:  Head: Normocephalic and atraumatic.  Nose: Nose normal.  Mouth/Throat: Oropharynx is clear and moist. No oropharyngeal exudate.  Eyes: Pupils are equal, round, and reactive to light. Conjunctivae and EOM are normal.  Neck: Normal range of motion. Neck supple. No JVD present. No tracheal deviation present. No thyromegaly present.  Cardiovascular: Normal rate, regular rhythm, normal heart sounds and intact distal pulses. Exam reveals no gallop and no friction rub.  No murmur heard. Pulmonary/Chest: Effort normal and breath sounds normal. No respiratory distress. She has no wheezes. She has no rales. She exhibits no tenderness. Right breast exhibits no inverted  nipple, no mass, no nipple discharge, no skin change and no tenderness. Left breast exhibits no inverted nipple, no mass, no nipple discharge, no skin change and no tenderness.  Abdominal: Soft. Bowel sounds are normal. There is no tenderness.  Genitourinary: Vagina normal and uterus normal. Rectal exam shows guaiac negative stool. No vaginal discharge found.  Genitourinary Comments: No tenderness, masses, or organomegaly present during bimanual exam.    Musculoskeletal:  Mild/moderate lower back pain. This is more severe with bending and twisting at the waist. No visible or palpable abnormalities present today.   Lymphadenopathy:    She has no cervical adenopathy.  Neurological: She is alert and oriented to person, place, and time. She displays normal reflexes. No cranial nerve deficit. Coordination normal.  Skin: Skin is warm and dry. Capillary refill takes less than 2 seconds. Rash noted. She is not diaphoretic.  Red, erythematous rash on the abdomen.   Psychiatric: She has a normal mood and affect. Her behavior is normal. Judgment and thought content normal.  Nursing note and vitals reviewed.    LABS: Recent Results (from the past 2160 hour(s))  POCT Urinalysis Dipstick     Status: Abnormal   Collection Time: 10/15/17  4:53 PM  Result Value Ref Range   Color, UA     Clarity, UA     Glucose, UA neg    Bilirubin, UA neg    Ketones, UA neg    Spec Grav, UA 1.010 1.010 - 1.025   Blood, UA neg    pH, UA 6.0 5.0 - 8.0   Protein, UA neg    Urobilinogen, UA negative (A) 0.2 or 1.0 E.U./dL   Nitrite, UA neg    Leukocytes, UA  Negative   Appearance     Odor    Urinalysis, Routine w reflex microscopic     Status: None   Collection Time: 12/12/17  4:00 AM  Result Value Ref Range   Specific Gravity, UA 1.023 1.005 - 1.030   pH, UA 5.0 5.0 - 7.5   Color, UA Yellow Yellow   Appearance Ur Clear Clear   Leukocytes, UA Negative Negative   Protein, UA Negative Negative/Trace   Glucose, UA Negative Negative   Ketones, UA Negative Negative   RBC, UA Negative Negative   Bilirubin, UA Negative Negative   Urobilinogen, Ur 0.2 0.2 - 1.0 mg/dL   Nitrite, UA Negative Negative   Microscopic Examination Comment     Comment: Microscopic not indicated and not performed.  Pap IG and HPV (high risk) DNA detection     Status: None   Collection Time: 12/12/17  4:30 AM  Result Value Ref Range   DIAGNOSIS: Comment     Comment: NEGATIVE FOR  INTRAEPITHELIAL LESION OR MALIGNANCY.   Specimen adequacy: Comment     Comment: Satisfactory for evaluation. No endocervical component is identified.   Clinician Provided ICD10 Comment     Comment: Z12.4   Performed by: Comment     Comment: Clenton Pare, Cytotechnologist (ASCP)   PAP Smear Comment .    Note: Comment     Comment: The Pap smear is a screening test designed to aid in the detection of premalignant and malignant conditions of the uterine cervix.  It is not a diagnostic procedure and should not be used as the sole means of detecting cervical cancer.  Both false-positive and false-negative reports do occur.    Test Methodology Comment     Comment: This liquid based ThinPrep(R) pap test was screened  with the use of an image guided system.    HPV, high-risk Negative Negative    Comment: This high-risk HPV test detects thirteen high-risk types (16/18/31/33/35/39/45/51/52/56/58/59/68) without differentiation.     Assessment/Plan: 1. Encounter for general adult medical examination with abnormal findings Annual health maintenance exam with pap smear today - CBC with Differential/Platelet - Comprehensive metabolic panel - T4, free - TSH  2. Essential hypertension Continue atenolol as prescribed.  - CBC with Differential/Platelet - Comprehensive metabolic panel - T4, free - TSH  3. Morbid obesity (HCC) Fasting labs ordered today.  - Lipid panel  4. Vitamin D deficiency - Vitamin D 1,25 dihydroxy  5. Atopic dermatitis, unspecified type Advised she use OTC hydrocortisone cream as needed for itching and irritation.   6. Routine cervical smear - Pap IG and HPV (high risk) DNA detection  7. Dysuria - Urinalysis, Routine w reflex microscopic  General Counseling: shannan slinker understanding of the findings of todays visit and agrees with plan of treatment. I have discussed any further diagnostic evaluation that may be needed or ordered today. We also reviewed her  medications today. she has been encouraged to call the office with any questions or concerns that should arise related to todays visit.   This patient was seen by Vincent Gros, FNP- C in Collaboration with Dr Lyndon Code as a part of collaborative care agreement  Orders Placed This Encounter  Procedures  . Urinalysis, Routine w reflex microscopic  . CBC with Differential/Platelet  . Comprehensive metabolic panel  . T4, free  . TSH  . Lipid panel  . Vitamin D 1,25 dihydroxy     Time spent: 14 Minutes      Lyndon Code, MD  Internal Medicine

## 2017-12-13 LAB — URINALYSIS, ROUTINE W REFLEX MICROSCOPIC
Bilirubin, UA: NEGATIVE
Glucose, UA: NEGATIVE
Ketones, UA: NEGATIVE
Leukocytes, UA: NEGATIVE
NITRITE UA: NEGATIVE
PH UA: 5 (ref 5.0–7.5)
PROTEIN UA: NEGATIVE
RBC UA: NEGATIVE
Specific Gravity, UA: 1.023 (ref 1.005–1.030)
UUROB: 0.2 mg/dL (ref 0.2–1.0)

## 2017-12-15 LAB — PAP IG AND HPV HIGH-RISK
HPV, high-risk: NEGATIVE
PAP Smear Comment: 0

## 2017-12-16 ENCOUNTER — Telehealth: Payer: Self-pay

## 2017-12-16 NOTE — Telephone Encounter (Signed)
Pt advised pap is normal 

## 2018-01-05 DIAGNOSIS — Z1211 Encounter for screening for malignant neoplasm of colon: Secondary | ICD-10-CM | POA: Insufficient documentation

## 2018-01-05 DIAGNOSIS — E559 Vitamin D deficiency, unspecified: Secondary | ICD-10-CM | POA: Insufficient documentation

## 2018-01-05 DIAGNOSIS — Z1239 Encounter for other screening for malignant neoplasm of breast: Secondary | ICD-10-CM

## 2018-01-05 DIAGNOSIS — L209 Atopic dermatitis, unspecified: Secondary | ICD-10-CM | POA: Insufficient documentation

## 2018-01-13 LAB — CBC WITH DIFFERENTIAL/PLATELET
BASOS ABS: 0 10*3/uL (ref 0.0–0.2)
Basos: 1 %
EOS (ABSOLUTE): 0.1 10*3/uL (ref 0.0–0.4)
EOS: 1 %
HEMATOCRIT: 41.3 % (ref 34.0–46.6)
Hemoglobin: 14.4 g/dL (ref 11.1–15.9)
IMMATURE GRANULOCYTES: 0 %
Immature Grans (Abs): 0 10*3/uL (ref 0.0–0.1)
Lymphocytes Absolute: 1.5 10*3/uL (ref 0.7–3.1)
Lymphs: 27 %
MCH: 31.8 pg (ref 26.6–33.0)
MCHC: 34.9 g/dL (ref 31.5–35.7)
MCV: 91 fL (ref 79–97)
MONOS ABS: 0.4 10*3/uL (ref 0.1–0.9)
Monocytes: 6 %
NEUTROS PCT: 65 %
Neutrophils Absolute: 3.6 10*3/uL (ref 1.4–7.0)
PLATELETS: 230 10*3/uL (ref 150–450)
RBC: 4.53 x10E6/uL (ref 3.77–5.28)
RDW: 12.6 % (ref 12.3–15.4)
WBC: 5.5 10*3/uL (ref 3.4–10.8)

## 2018-01-13 LAB — VITAMIN D 1,25 DIHYDROXY
VITAMIN D 1, 25 (OH) TOTAL: 30 pg/mL
Vitamin D2 1, 25 (OH)2: <10 pg/mL
Vitamin D3 1, 25 (OH)2: 29 pg/mL

## 2018-01-13 LAB — COMPREHENSIVE METABOLIC PANEL
ALK PHOS: 91 IU/L (ref 39–117)
ALT: 46 IU/L — AB (ref 0–32)
AST: 38 IU/L (ref 0–40)
Albumin/Globulin Ratio: 1.7 (ref 1.2–2.2)
Albumin: 4.5 g/dL (ref 3.5–5.5)
BUN/Creatinine Ratio: 11 (ref 9–23)
BUN: 10 mg/dL (ref 6–24)
Bilirubin Total: 0.6 mg/dL (ref 0.0–1.2)
CO2: 22 mmol/L (ref 20–29)
CREATININE: 0.89 mg/dL (ref 0.57–1.00)
Calcium: 9.7 mg/dL (ref 8.7–10.2)
Chloride: 99 mmol/L (ref 96–106)
GFR calc Af Amer: 88 mL/min/{1.73_m2} (ref >59)
GFR, EST NON AFRICAN AMERICAN: 76 mL/min/{1.73_m2} (ref >59)
GLOBULIN, TOTAL: 2.6 g/dL (ref 1.5–4.5)
GLUCOSE: 196 mg/dL — AB (ref 65–99)
Potassium: 4.5 mmol/L (ref 3.5–5.2)
Sodium: 136 mmol/L (ref 134–144)
Total Protein: 7.1 g/dL (ref 6.0–8.5)

## 2018-01-13 LAB — TSH: TSH: 2.56 u[IU]/mL (ref 0.450–4.500)

## 2018-01-13 LAB — LIPID PANEL
CHOL/HDL RATIO: 5.5 ratio — AB (ref 0.0–4.4)
Cholesterol, Total: 213 mg/dL — ABNORMAL HIGH (ref 100–199)
HDL: 39 mg/dL — ABNORMAL LOW (ref >39)
LDL CALC: 128 mg/dL — AB (ref 0–99)
Triglycerides: 229 mg/dL — ABNORMAL HIGH (ref 0–149)
VLDL CHOLESTEROL CAL: 46 mg/dL — AB (ref 5–40)

## 2018-01-13 LAB — T4, FREE: FREE T4: 1.02 ng/dL (ref 0.82–1.77)

## 2018-01-23 ENCOUNTER — Other Ambulatory Visit: Payer: Self-pay

## 2018-01-23 MED ORDER — ATENOLOL 25 MG PO TABS: ORAL_TABLET | ORAL | 3 refills | Status: DC

## 2018-02-25 ENCOUNTER — Telehealth: Payer: Self-pay

## 2018-02-25 NOTE — Telephone Encounter (Signed)
Patient dropped off paperworkfor heather to sign for health form. Beth

## 2018-05-02 ENCOUNTER — Other Ambulatory Visit: Payer: Self-pay

## 2018-05-02 DIAGNOSIS — M545 Low back pain, unspecified: Secondary | ICD-10-CM

## 2018-05-02 MED ORDER — MELOXICAM 15 MG PO TABS: 15.0000 mg | ORAL_TABLET | Freq: Every day | ORAL | 3 refills | Status: DC

## 2018-05-02 MED ORDER — ATENOLOL 25 MG PO TABS: ORAL_TABLET | ORAL | 3 refills | Status: DC

## 2018-05-23 DIAGNOSIS — Z23 Encounter for immunization: Secondary | ICD-10-CM | POA: Diagnosis not present

## 2018-06-16 DIAGNOSIS — H524 Presbyopia: Secondary | ICD-10-CM | POA: Diagnosis not present

## 2018-07-05 DIAGNOSIS — H5213 Myopia, bilateral: Secondary | ICD-10-CM | POA: Diagnosis not present

## 2018-07-15 ENCOUNTER — Ambulatory Visit: Payer: BLUE CROSS/BLUE SHIELD | Admitting: Nurse Practitioner

## 2018-07-15 ENCOUNTER — Encounter: Payer: Self-pay | Admitting: Nurse Practitioner

## 2018-07-15 VITALS — BP 137/83 | HR 82 | Resp 16 | Ht 62.0 in | Wt 260.0 lb

## 2018-07-15 DIAGNOSIS — M545 Low back pain, unspecified: Secondary | ICD-10-CM

## 2018-07-15 DIAGNOSIS — F411 Generalized anxiety disorder: Secondary | ICD-10-CM | POA: Diagnosis not present

## 2018-07-15 DIAGNOSIS — I10 Essential (primary) hypertension: Secondary | ICD-10-CM | POA: Diagnosis not present

## 2018-07-15 DIAGNOSIS — F410 Panic disorder [episodic paroxysmal anxiety] without agoraphobia: Secondary | ICD-10-CM

## 2018-07-15 DIAGNOSIS — Z1239 Encounter for other screening for malignant neoplasm of breast: Secondary | ICD-10-CM | POA: Diagnosis not present

## 2018-07-15 MED ORDER — FLUOXETINE HCL 10 MG PO TABS: 10.0000 mg | ORAL_TABLET | Freq: Every day | ORAL | 3 refills | Status: DC

## 2018-07-15 MED ORDER — ALPRAZOLAM 0.25 MG PO TABS: 0.2500 mg | ORAL_TABLET | Freq: Every evening | ORAL | 2 refills | Status: DC | PRN

## 2018-07-15 MED ORDER — CYCLOBENZAPRINE HCL 5 MG PO TABS: 5.0000 mg | ORAL_TABLET | Freq: Two times a day (BID) | ORAL | 1 refills | Status: DC | PRN

## 2018-07-15 NOTE — Progress Notes (Signed)
[facility] [address] [place], [place] [address]  Internal MEDICINE  Office Visit Note  Patient Name: Rebecca PandaKaren J Henry  [id number]  [id number]  Date of Service: 07/16/2018  Chief Complaint  Patient presents with  . Hyperlipidemia  . Anxiety  . Quality Metric Gaps    Colonoscopy and mammogram     The patient is here for routine follow up visit. Recently switched jobs and she feels as though she is under great deal of stress. Cries frequently. Has long history of depression. Last medication she took for depression was In 2016 and was wellbutrin. Had to stop it after some time as she developed heavy vaginal bleeding after she had not had a period in several years. When she gets anxious, she gets short of breath and hyperventilates. She feels the anxiety and depression is cyclical. Curious if its not related to hormones. Did check thyroid panel back in May and it was normal. Did not check reproductive hormones.  Blood pressure is doing well with current medication. Denies chest pain or chest pressure.       Current Medication: Outpatient Encounter Medications as of 07/15/2018  Medication Sig  . ALPRAZolam (XANAX) 0.25 MG tablet Take 1 tablet (0.25 mg total) by mouth at bedtime as needed for anxiety (weeknights after 6pm).  Marland Kitchen. atenolol (TENORMIN) 25 MG tablet Take one tablet by mouth daily  . cetirizine (ZYRTEC) 5 MG tablet Take 5 mg by mouth daily.  . chlorpheniramine (CHLOR-TRIMETON) 4 MG tablet Take 4 mg by mouth daily.  . cyclobenzaprine (FLEXERIL) 5 MG tablet Take 1 tablet (5 mg total) by mouth 2 (two) times daily as needed.  . loratadine (CLARITIN) 10 MG tablet Take 10 mg by mouth daily.  . meloxicam (MOBIC) 15 MG tablet Take 1 tablet (15 mg total) by mouth daily.  . [DISCONTINUED] ALPRAZolam (XANAX) 0.25 MG tablet Take 1 tablet (0.25 mg total) by mouth at bedtime as needed for anxiety (weeknights after 6pm).  . [DISCONTINUED] cyclobenzaprine (FLEXERIL) 5 MG tablet  Take by mouth 2 (two) times daily as needed.   Marland Kitchen. FLUoxetine (PROZAC) 10 MG tablet Take 1 tablet (10 mg total) by mouth daily.  . [DISCONTINUED] benzonatate (TESSALON) 100 MG capsule Take 1-2 capsules (100-200 mg total) by mouth 3 (three) times daily as needed for cough. (Patient not taking: Reported on 09/30/2017)  . [DISCONTINUED] oseltamivir (TAMIFLU) 75 MG capsule Take 1 capsule (75 mg total) by mouth 2 (two) times daily. (Patient not taking: Reported on 09/30/2017)   No facility-administered encounter medications on file as of 07/15/2018.     Surgical History: Past Surgical History:  Procedure Laterality Date  . CESAREAN SECTION      Medical History: Past Medical History:  Diagnosis Date  . Allergic rhinitis   . Anxiety   . Hyperlipidemia   . Sleep apnea     Family History: Family History  Problem Relation Age of Onset  . Hypertension Mother   . Diabetes Maternal Grandmother   . Hypothyroidism Maternal Grandmother     Social History   Socioeconomic History  . Marital status: Single    Spouse name: Not on file  . Number of children: Not on file  . Years of education: Not on file  . Highest education level: Not on file  Occupational History  . Not on file  Social Needs  . Financial resource strain: Not on file  . Food insecurity:    Worry: Not on file    Inability: Not on  file  . Transportation needs:    Medical: Not on file    Non-medical: Not on file  Tobacco Use  . Smoking status: Never Smoker  . Smokeless tobacco: Never Used  Substance and Sexual Activity  . Alcohol use: No  . Drug use: No  . Sexual activity: Not on file  Lifestyle  . Physical activity:    Days per week: Not on file    Minutes per session: Not on file  . Stress: Not on file  Relationships  . Social connections:    Talks on phone: Not on file    Gets together: Not on file    Attends religious service: Not on file    Active member of club or organization: Not on file    Attends  meetings of clubs or organizations: Not on file    Relationship status: Not on file  . Intimate partner violence:    Fear of current or ex partner: Not on file    Emotionally abused: Not on file    Physically abused: Not on file    Forced sexual activity: Not on file  Other Topics Concern  . Not on file  Social History Narrative  . Not on file      Review of Systems  Constitutional: Positive for fatigue. Negative for activity change, chills and unexpected weight change.  HENT: Negative for congestion, ear pain, postnasal drip, rhinorrhea, sneezing, sore throat and voice change.   Respiratory: Negative for cough, chest tightness and shortness of breath.   Cardiovascular: Negative for chest pain and palpitations.  Gastrointestinal: Negative for abdominal pain, constipation, diarrhea, nausea and vomiting.  Endocrine: Negative for cold intolerance, heat intolerance, polydipsia, polyphagia and polyuria.  Musculoskeletal: Positive for back pain and myalgias. Negative for arthralgias, joint swelling and neck pain.  Skin: Negative for rash.  Allergic/Immunologic: Positive for environmental allergies.  Neurological: Negative for dizziness, tremors, numbness and headaches.  Hematological: Negative for adenopathy. Does not bruise/bleed easily.  Psychiatric/Behavioral: Positive for dysphoric mood. Negative for behavioral problems (Depression), sleep disturbance and suicidal ideas. The patient is nervous/anxious.     Today's Vitals   07/15/18 1408  BP: 137/83  Pulse: 82  Resp: 16  SpO2: 96%  Weight: 260 lb (117.9 kg)  Height: 5\' 2"  (1.575 m)    Physical Exam  Constitutional: She is oriented to person, place, and time. She appears well-developed and well-nourished. No distress.  HENT:  Head: Normocephalic and atraumatic.  Nose: Nose normal.  Mouth/Throat: No oropharyngeal exudate.  Eyes: Pupils are equal, round, and reactive to light. EOM are normal.  Neck: Normal range of motion.  Neck supple. No JVD present. No tracheal deviation present. No thyromegaly present.  Cardiovascular: Normal rate, regular rhythm and normal heart sounds. Exam reveals no gallop and no friction rub.  No murmur heard. Pulmonary/Chest: Effort normal and breath sounds normal. No respiratory distress. She has no wheezes. She has no rales. She exhibits no tenderness.  Abdominal: Soft. Bowel sounds are normal. There is no tenderness.  Musculoskeletal: Normal range of motion.  Lymphadenopathy:    She has no cervical adenopathy.  Neurological: She is alert and oriented to person, place, and time. No cranial nerve deficit.  Skin: Skin is warm and dry. Capillary refill takes less than 2 seconds. She is not diaphoretic.  Psychiatric: Her speech is normal and behavior is normal. Judgment and thought content normal. Her mood appears anxious. Cognition and memory are normal. She exhibits a depressed mood.  Nursing note and  vitals reviewed.  Assessment/Plan: 1. Essential hypertension bp stable. Continue atenolol at current dose   2. Generalized anxiety disorder with panic attacks Start fluoxetine 10mg  tablets daily. May continue to take alprazolam 0.25mg  at bedtime as needed for acute anxiety.insomnia. New prescription sent to her  - FLUoxetine (PROZAC) 10 MG tablet; Take 1 tablet (10 mg total) by mouth daily.  Dispense: 30 tablet; Refill: 3 - ALPRAZolam (XANAX) 0.25 MG tablet; Take 1 tablet (0.25 mg total) by mouth at bedtime as needed for anxiety (weeknights after 6pm).  Dispense: 30 tablet; Refill: 2  3. Low back pain at multiple sites May take flexeril 5mg  up to twice daily if needed for low back pain and muscle aches. Refills provided today.  - cyclobenzaprine (FLEXERIL) 5 MG tablet; Take 1 tablet (5 mg total) by mouth 2 (two) times daily as needed.  Dispense: 45 tablet; Refill: 1  4. Screening for breast cancer - MM 3D SCREEN BREAST BILATERAL; Future   General Counseling: daylyn christine  understanding of the findings of todays visit and agrees with plan of treatment. I have discussed any further diagnostic evaluation that may be needed or ordered today. We also reviewed her medications today. she has been encouraged to call the office with any questions or concerns that should arise related to todays visit.  Hypertension Counseling:   The following hypertensive lifestyle modification were recommended and discussed:  1. Limiting alcohol intake to less than 1 oz/day of ethanol:(24 oz of beer or 8 oz of wine or 2 oz of 100-proof whiskey). 2. Take baby ASA 81 mg daily. 3. Importance of regular aerobic exercise and losing weight. 4. Reduce dietary saturated fat and cholesterol intake for overall cardiovascular health. 5. Maintaining adequate dietary potassium, calcium, and magnesium intake. 6. Regular monitoring of the blood pressure. 7. Reduce sodium intake to less than 100 mmol/day (less than 2.3 gm of sodium or less than 6 gm of sodium choride)   This patient was seen by Vincent Gros FNP Collaboration with Dr Lyndon Code as a part of collaborative care agreement  Orders Placed This Encounter  Procedures  . MM 3D SCREEN BREAST BILATERAL    Meds ordered this encounter  Medications  . FLUoxetine (PROZAC) 10 MG tablet    Sig: Take 1 tablet (10 mg total) by mouth daily.    Dispense:  30 tablet    Refill:  3    Order Specific Question:   Supervising Provider    Answer:   Lyndon Code [[id number]]  . cyclobenzaprine (FLEXERIL) 5 MG tablet    Sig: Take 1 tablet (5 mg total) by mouth 2 (two) times daily as needed.    Dispense:  45 tablet    Refill:  1    Order Specific Question:   Supervising Provider    Answer:   Lyndon Code [[id number]]  . ALPRAZolam (XANAX) 0.25 MG tablet    Sig: Take 1 tablet (0.25 mg total) by mouth at bedtime as needed for anxiety (weeknights after 6pm).    Dispense:  30 tablet    Refill:  2    Please hold for when patient needs fill. thanks    Order  Specific Question:   Supervising Provider    Answer:   Lyndon Code [[id number]]    Time spent: 25 Minutes      Dr Lyndon Code Internal medicine

## 2018-07-31 ENCOUNTER — Other Ambulatory Visit: Payer: Self-pay | Admitting: Nurse Practitioner

## 2018-07-31 DIAGNOSIS — E782 Mixed hyperlipidemia: Secondary | ICD-10-CM | POA: Diagnosis not present

## 2018-07-31 DIAGNOSIS — R7301 Impaired fasting glucose: Secondary | ICD-10-CM | POA: Diagnosis not present

## 2018-07-31 DIAGNOSIS — F411 Generalized anxiety disorder: Secondary | ICD-10-CM | POA: Diagnosis not present

## 2018-07-31 DIAGNOSIS — I10 Essential (primary) hypertension: Secondary | ICD-10-CM | POA: Diagnosis not present

## 2018-07-31 DIAGNOSIS — E559 Vitamin D deficiency, unspecified: Secondary | ICD-10-CM | POA: Diagnosis not present

## 2018-08-01 LAB — CBC
Hematocrit: 42.5 % (ref 34.0–46.6)
Hemoglobin: 15 g/dL (ref 11.1–15.9)
MCH: 31.7 pg (ref 26.6–33.0)
MCHC: 35.3 g/dL (ref 31.5–35.7)
MCV: 90 fL (ref 79–97)
Platelets: 247 10*3/uL (ref 150–450)
RBC: 4.73 x10E6/uL (ref 3.77–5.28)
RDW: 11.7 % — ABNORMAL LOW (ref 12.3–15.4)
WBC: 6.8 10*3/uL (ref 3.4–10.8)

## 2018-08-01 LAB — T3: T3, Total: 131 ng/dL (ref 71–180)

## 2018-08-01 LAB — COMPREHENSIVE METABOLIC PANEL
ALT: 46 IU/L — ABNORMAL HIGH (ref 0–32)
AST: 41 IU/L — AB (ref 0–40)
Albumin/Globulin Ratio: 1.7 (ref 1.2–2.2)
Albumin: 4.5 g/dL (ref 3.5–5.5)
Alkaline Phosphatase: 115 IU/L (ref 39–117)
BUN/Creatinine Ratio: 12 (ref 9–23)
BUN: 10 mg/dL (ref 6–24)
Bilirubin Total: 0.6 mg/dL (ref 0.0–1.2)
CO2: 23 mmol/L (ref 20–29)
Calcium: 9.5 mg/dL (ref 8.7–10.2)
Chloride: 97 mmol/L (ref 96–106)
Creatinine, Ser: 0.86 mg/dL (ref 0.57–1.00)
GFR calc Af Amer: 91 mL/min/{1.73_m2} (ref >59)
GFR, EST NON AFRICAN AMERICAN: 79 mL/min/{1.73_m2} (ref >59)
Globulin, Total: 2.7 g/dL (ref 1.5–4.5)
Glucose: 261 mg/dL — ABNORMAL HIGH (ref 65–99)
Potassium: 4.6 mmol/L (ref 3.5–5.2)
Sodium: 134 mmol/L (ref 134–144)
Total Protein: 7.2 g/dL (ref 6.0–8.5)

## 2018-08-01 LAB — LIPID PANEL W/O CHOL/HDL RATIO
Cholesterol, Total: 212 mg/dL — ABNORMAL HIGH (ref 100–199)
HDL: 38 mg/dL — ABNORMAL LOW (ref >39)
LDL Calculated: 119 mg/dL — ABNORMAL HIGH (ref 0–99)
Triglycerides: 273 mg/dL — ABNORMAL HIGH (ref 0–149)
VLDL Cholesterol Cal: 55 mg/dL — ABNORMAL HIGH (ref 5–40)

## 2018-08-01 LAB — VITAMIN D 25 HYDROXY (VIT D DEFICIENCY, FRACTURES): Vit D, 25-Hydroxy: 25.6 ng/mL — ABNORMAL LOW (ref 30.0–100.0)

## 2018-08-01 LAB — TSH: TSH: 2.23 u[IU]/mL (ref 0.450–4.500)

## 2018-08-01 LAB — FSH/LH
FSH: 19.4 m[IU]/mL
LH: 11.7 m[IU]/mL

## 2018-08-01 LAB — T4, FREE: FREE T4: 1.04 ng/dL (ref 0.82–1.77)

## 2018-08-01 LAB — PROLACTIN: Prolactin: 5.4 ng/mL (ref 4.8–23.3)

## 2018-08-01 LAB — ESTRADIOL: Estradiol: 18.8 pg/mL

## 2018-08-01 NOTE — Progress Notes (Signed)
Hey. Can you have them add HgbA1c to her labs. Blood sugar was high. Thanks.

## 2018-08-02 LAB — SPECIMEN STATUS REPORT

## 2018-08-02 LAB — HGB A1C W/O EAG: Hgb A1c MFr Bld: 9.1 % — ABNORMAL HIGH (ref 4.8–5.6)

## 2018-08-14 ENCOUNTER — Telehealth: Payer: Self-pay | Admitting: Nurse Practitioner

## 2018-08-14 NOTE — Telephone Encounter (Signed)
Tried calling patient to schedule appointment to discuss lab results per Vincent GrosHeather Boscia, she needs to be seen to go over results. Beth

## 2018-08-17 ENCOUNTER — Other Ambulatory Visit: Payer: Self-pay

## 2018-08-17 ENCOUNTER — Emergency Department: Payer: BLUE CROSS/BLUE SHIELD

## 2018-08-17 ENCOUNTER — Emergency Department
Admission: EM | Admit: 2018-08-17 | Discharge: 2018-08-17 | Disposition: A | Discharge status: Home / Self Care | Payer: BLUE CROSS/BLUE SHIELD | Attending: Emergency Medicine | Admitting: Emergency Medicine

## 2018-08-17 DIAGNOSIS — E1165 Type 2 diabetes mellitus with hyperglycemia: Secondary | ICD-10-CM | POA: Diagnosis not present

## 2018-08-17 DIAGNOSIS — Z79899 Other long term (current) drug therapy: Secondary | ICD-10-CM | POA: Diagnosis not present

## 2018-08-17 DIAGNOSIS — M25512 Pain in left shoulder: Secondary | ICD-10-CM | POA: Diagnosis not present

## 2018-08-17 DIAGNOSIS — E785 Hyperlipidemia, unspecified: Secondary | ICD-10-CM | POA: Diagnosis not present

## 2018-08-17 DIAGNOSIS — M79602 Pain in left arm: Secondary | ICD-10-CM | POA: Diagnosis present

## 2018-08-17 DIAGNOSIS — R079 Chest pain, unspecified: Secondary | ICD-10-CM | POA: Diagnosis not present

## 2018-08-17 LAB — CBC
HCT: 43.3 % (ref 36.0–46.0)
Hemoglobin: 15 g/dL (ref 12.0–15.0)
MCH: 31.1 pg (ref 26.0–34.0)
MCHC: 34.6 g/dL (ref 30.0–36.0)
MCV: 89.8 fL (ref 80.0–100.0)
Platelets: 253 10*3/uL (ref 150–400)
RBC: 4.82 MIL/uL (ref 3.87–5.11)
RDW: 11.9 % (ref 11.5–15.5)
WBC: 7.2 10*3/uL (ref 4.0–10.5)
nRBC: 0 % (ref 0.0–0.2)

## 2018-08-17 LAB — BASIC METABOLIC PANEL
ANION GAP: 9 (ref 5–15)
BUN: 9 mg/dL (ref 6–20)
CALCIUM: 9.2 mg/dL (ref 8.9–10.3)
CO2: 22 mmol/L (ref 22–32)
Chloride: 99 mmol/L (ref 98–111)
Creatinine, Ser: 0.72 mg/dL (ref 0.44–1.00)
GFR calc Af Amer: >60 mL/min (ref >60)
GFR calc non Af Amer: >60 mL/min (ref >60)
Glucose, Bld: 340 mg/dL — ABNORMAL HIGH (ref 70–99)
Potassium: 4 mmol/L (ref 3.5–5.1)
Sodium: 130 mmol/L — ABNORMAL LOW (ref 135–145)

## 2018-08-17 LAB — GLUCOSE, CAPILLARY: GLUCOSE-CAPILLARY: 316 mg/dL — AB (ref 70–99)

## 2018-08-17 LAB — TROPONIN I
Troponin I: <0.03 ng/mL (ref <0.03)
Troponin I: <0.03 ng/mL (ref <0.03)

## 2018-08-17 MED ORDER — IBUPROFEN 600 MG PO TABS: 600.0000 mg | ORAL_TABLET | Freq: Four times a day (QID) | ORAL | 0 refills | Status: DC | PRN

## 2018-08-17 MED ORDER — METFORMIN HCL 500 MG PO TABS
500.0000 mg | ORAL_TABLET | Freq: Once | ORAL | Status: AC
  Administered 2018-08-17: 500 mg via ORAL
  Filled 2018-08-17: qty 1

## 2018-08-17 MED ORDER — BLOOD GLUCOSE MONITOR KIT: PACK | 0 refills | Status: AC

## 2018-08-17 MED ORDER — SODIUM CHLORIDE 0.9 % IV BOLUS
1000.0000 mL | Freq: Once | INTRAVENOUS | Status: AC
  Administered 2018-08-17: 1000 mL via INTRAVENOUS

## 2018-08-17 MED ORDER — METFORMIN HCL 500 MG PO TABS: 500.0000 mg | ORAL_TABLET | Freq: Two times a day (BID) | ORAL | 1 refills | Status: DC

## 2018-08-17 MED ORDER — KETOROLAC TROMETHAMINE 30 MG/ML IJ SOLN
15.0000 mg | Freq: Once | INTRAMUSCULAR | Status: AC
  Administered 2018-08-17: 15 mg via INTRAVENOUS
  Filled 2018-08-17: qty 1

## 2018-08-17 NOTE — ED Triage Notes (Signed)
First RN Note: Pt c/o sudden onset L shoulder pain since approx 0630 this morning. Pt states pain is under L shoulder blade and radiates down to L hand. Pt noted to be tearful on arrival, however states is from pain.

## 2018-08-17 NOTE — Discharge Instructions (Addendum)
For pain take ibuprofen 600mg  every 6 hours, apply ice. Follow up with your doctor.  For your elevated glucose, start metformin as prescribed and follow up with your doctor on your appointment tomorrow.

## 2018-08-17 NOTE — ED Provider Notes (Signed)
[facility] Emergency Department Provider Note  ____________________________________________  Time seen: Approximately 6:17 PM  I have reviewed the triage vital signs and the nursing notes.   HISTORY  Chief Complaint Arm Pain   HPI Rebecca Henry is a 53 y.o. female with history as listed below who presents for evaluation of left arm pain.  Patient reports that she woke up at 6:30 AM with pain in her arm.  The pain is sharp cramping, intermittent, patient is unable to point exactly where but points to the proximal left arm as the location of the pain.  No chest pain or shortness of breath.  When the pain becomes severe she has tingling in her hand.  Pain is worse with movement of the arm and better when she lays still.  Patient denies ever having similar pain in the past.  No trauma.  Past Medical History:  Diagnosis Date  . Allergic rhinitis   . Anxiety   . Hyperlipidemia   . Sleep apnea     Patient Active Problem List   Diagnosis Date Noted  . Screening for breast cancer 01/05/2018  . Morbid obesity ([place]) 01/05/2018  . Vitamin D deficiency 01/05/2018  . Atopic dermatitis 01/05/2018  . Dysuria 10/15/2017  . Urinary tract infection without hematuria 10/15/2017  . Impaired fasting glucose 10/15/2017  . Low back pain at multiple sites 10/13/2017  . Generalized anxiety disorder with panic attacks 10/13/2017  . Essential hypertension 10/13/2017    Past Surgical History:  Procedure Laterality Date  . CESAREAN SECTION      Prior to Admission medications   Medication Sig Start Date End Date Taking? Authorizing Provider  ALPRAZolam (XANAX) 0.25 MG tablet Take 1 tablet (0.25 mg total) by mouth at bedtime as needed for anxiety (weeknights after 6pm). 07/15/18   Ronnell Freshwater, NP  atenolol (TENORMIN) 25 MG tablet Take one tablet by mouth daily 05/02/18   Ronnell Freshwater, NP  blood glucose meter kit and supplies KIT Dispense based on patient and  insurance preference. Use up to four times daily as directed. (FOR ICD-9 250.00, 250.01). 08/17/18   Alfred Levins, [place], MD  cetirizine (ZYRTEC) 5 MG tablet Take 5 mg by mouth daily.    [provider]  chlorpheniramine (CHLOR-TRIMETON) 4 MG tablet Take 4 mg by mouth daily.    [provider]  cyclobenzaprine (FLEXERIL) 5 MG tablet Take 1 tablet (5 mg total) by mouth 2 (two) times daily as needed. 07/15/18   Ronnell Freshwater, NP  FLUoxetine (PROZAC) 10 MG tablet Take 1 tablet (10 mg total) by mouth daily. 07/15/18   Ronnell Freshwater, NP  ibuprofen (ADVIL,MOTRIN) 600 MG tablet Take 1 tablet (600 mg total) by mouth every 6 (six) hours as needed. 08/17/18   Rudene Re, MD  loratadine (CLARITIN) 10 MG tablet Take 10 mg by mouth daily.    [provider]  meloxicam (MOBIC) 15 MG tablet Take 1 tablet (15 mg total) by mouth daily. 05/02/18   Ronnell Freshwater, NP  metFORMIN (GLUCOPHAGE) 500 MG tablet Take 1 tablet (500 mg total) by mouth 2 (two) times daily with a meal. 08/17/18   Rudene Re, MD    Allergies Patient has no known allergies.  Family History  Problem Relation Age of Onset  . Hypertension Mother   . Diabetes Maternal Grandmother   . Hypothyroidism Maternal Grandmother     Social History Social History   Tobacco Use  . Smoking status: Never Smoker  .  Smokeless tobacco: Never Used  Substance Use Topics  . Alcohol use: No  . Drug use: No    Review of Systems  Constitutional: Negative for fever. Eyes: Negative for visual changes. ENT: Negative for sore throat. Neck: No neck pain  Cardiovascular: Negative for chest pain. Respiratory: Negative for shortness of breath. Gastrointestinal: Negative for abdominal pain, vomiting or diarrhea. Genitourinary: Negative for dysuria. Musculoskeletal: Negative for back pain. + L shoulder/ arm pain Skin: Negative for rash. Neurological: Negative for headaches, weakness or numbness. Psych: No SI  or HI  ____________________________________________   PHYSICAL EXAM:  VITAL SIGNS: ED Triage Vitals  Enc Vitals Group     BP 08/17/18 1351 132/72     Pulse Rate 08/17/18 1351 95     Resp 08/17/18 1351 18     Temp 08/17/18 1351 98.3 F (36.8 C)     Temp Source 08/17/18 1726 Oral     SpO2 08/17/18 1351 95 %     Weight 08/17/18 1347 260 lb (117.9 kg)     Height 08/17/18 1347 '5\' 2"'$  (1.575 m)     Head Circumference --      Peak Flow --      Pain Score 08/17/18 1347 10     Pain Loc --      Pain Edu? --      Excl. in [place]? --     Constitutional: Alert and oriented. Well appearing and in no apparent distress. HEENT:      Head: Normocephalic and atraumatic.         Eyes: Conjunctivae are normal. Sclera is non-icteric.       Mouth/Throat: Mucous membranes are moist.       Neck: Supple with no signs of meningismus. Cardiovascular: Regular rate and rhythm. No murmurs, gallops, or rubs. 2+ symmetrical distal pulses are present in all extremities. No JVD. Respiratory: Normal respiratory effort. Lungs are clear to auscultation bilaterally. No wheezes, crackles, or rhonchi.  Gastrointestinal: Soft, non tender, and non distended with positive bowel sounds. No rebound or guarding. Musculoskeletal: Palpation of the L Ac joint reproduces the pain, pain also reproducible with abduction and extension of the shoulder. No midline c/t/l spine tenderness Neurologic: Normal speech and language. Face is symmetric. Moving all extremities. No gross focal neurologic deficits are appreciated. Skin: Skin is warm, dry and intact. No rash noted. Psychiatric: Mood and affect are normal. Speech and behavior are normal.  ____________________________________________   LABS (all labs ordered are listed, but only abnormal results are displayed)  Labs Reviewed  BASIC METABOLIC PANEL - Abnormal; Notable for the following components:      Result Value   Sodium 130 (*)    Glucose, Bld 340 (*)    All other  components within normal limits  GLUCOSE, CAPILLARY - Abnormal; Notable for the following components:   Glucose-Capillary 316 (*)    All other components within normal limits  CBC  TROPONIN I  TROPONIN I  POC URINE PREG, ED   ____________________________________________  EKG  ED ECG REPORT I, Rudene Re, the attending physician, personally viewed and interpreted this ECG.  Normal sinus rhythm, rate of 98, normal intervals, normal axis, no ST elevations or depressions, diffuse T wave flattening.  Flattening is new when compared to prior from 2015 ____________________________________________  RADIOLOGY  I have personally reviewed the images performed during this visit and I agree with the Radiologist's read.   Interpretation by Radiologist:  Dg Chest 2 View  Result Date: 08/17/2018 CLINICAL DATA:  Left shoulder pain.  Chest pain. EXAM: CHEST - 2 VIEW COMPARISON:  Two-view chest x-ray 10/01/2014 FINDINGS: Heart size normal. There is no edema or effusion. No focal airspace disease is present. Mild thoracic kyphosis is stable. Asymmetric left AC joint degenerative changes have progressed. IMPRESSION: 1. No acute cardiopulmonary disease. 2. Progressive left AC joint arthropathy. Electronically Signed   By: San Morelle M.D.   On: 08/17/2018 15:04     ____________________________________________   PROCEDURES  Procedure(s) performed: None Procedures Critical Care performed:  None ____________________________________________   INITIAL IMPRESSION / ASSESSMENT AND PLAN / ED COURSE  53 y.o. female with history as listed below who presents for evaluation of left arm pain.  Patient looks uncomfortable, pain is reproducible palpation of AC joint and abduction and extension at the left shoulder joint.  No clinical signs of septic joint, ischemic limb, no rashes, no evidence of cellulitis or shingles, muscle are soft on palpation.  Chest x-ray showing left AC joint  arthropathy and pain is reproducible with palpation of the AC joint.  Will give a dose of IV Toradol.  EKG with no ischemic changes.  Troponin is negative. Labs showing hyperglycemia.  Patient recently had her annual labs done and was found to have an elevated hemoglobin A1c and hyperglycemia.  She has not been started on any medications yet.  She has a follow-up appointment with her primary care doctor tomorrow.  Will give IV fluids to help lower her sugar of 340.  No signs of DKA.    _________________________ 7:53 PM on 08/17/2018 -----------------------------------------  After a dose of IV Toradol and ice patient reports full resolution of her pain.  Sugars trending down.  Patient started on metformin.  Recommended follow-up with her doctor in her already scheduled appointment for tomorrow.  Discussed and return precautions.   As part of my medical decision making, I reviewed the following data within the Protection notes reviewed and incorporated, Labs reviewed , EKG interpreted , Old EKG reviewed, Old chart reviewed, Radiograph reviewed , Notes from prior ED visits and [place] Controlled Substance Database    Pertinent labs & imaging results that were available during my care of the patient were reviewed by me and considered in my medical decision making (see chart for details).    ____________________________________________   FINAL CLINICAL IMPRESSION(S) / ED DIAGNOSES  Final diagnoses:  Acute pain of left shoulder  Type 2 diabetes mellitus with hyperglycemia, without long-term current use of insulin (HCC)      NEW MEDICATIONS STARTED DURING THIS VISIT:  ED Discharge Orders         Ordered    ibuprofen (ADVIL,MOTRIN) 600 MG tablet  Every 6 hours PRN     08/17/18 1951    metFORMIN (GLUCOPHAGE) 500 MG tablet  2 times daily with meals     08/17/18 1951    blood glucose meter kit and supplies KIT     08/17/18 1951           Note:  This document was  prepared using Dragon voice recognition software and may include unintentional dictation errors.    Alfred Levins [place], MD 08/17/18 [phone]

## 2018-08-18 ENCOUNTER — Encounter: Payer: Self-pay | Admitting: Nurse Practitioner

## 2018-08-18 ENCOUNTER — Ambulatory Visit: Payer: BLUE CROSS/BLUE SHIELD | Admitting: Nurse Practitioner

## 2018-08-18 VITALS — BP 136/77 | HR 87 | Resp 16 | Ht 62.5 in | Wt 259.0 lb

## 2018-08-18 DIAGNOSIS — F411 Generalized anxiety disorder: Secondary | ICD-10-CM | POA: Diagnosis not present

## 2018-08-18 DIAGNOSIS — E1165 Type 2 diabetes mellitus with hyperglycemia: Secondary | ICD-10-CM | POA: Diagnosis not present

## 2018-08-18 DIAGNOSIS — F419 Anxiety disorder, unspecified: Secondary | ICD-10-CM | POA: Insufficient documentation

## 2018-08-18 DIAGNOSIS — E782 Mixed hyperlipidemia: Secondary | ICD-10-CM

## 2018-08-18 DIAGNOSIS — F410 Panic disorder [episodic paroxysmal anxiety] without agoraphobia: Secondary | ICD-10-CM

## 2018-08-18 DIAGNOSIS — M19012 Primary osteoarthritis, left shoulder: Secondary | ICD-10-CM

## 2018-08-18 MED ORDER — IBUPROFEN 600 MG PO TABS: 600.0000 mg | ORAL_TABLET | Freq: Four times a day (QID) | ORAL | 0 refills | Status: DC | PRN

## 2018-08-18 MED ORDER — METFORMIN HCL 500 MG PO TABS: 500.0000 mg | ORAL_TABLET | Freq: Two times a day (BID) | ORAL | 1 refills | Status: DC

## 2018-08-18 MED ORDER — ATORVASTATIN CALCIUM 20 MG PO TABS: 20.0000 mg | ORAL_TABLET | Freq: Every day | ORAL | 3 refills | Status: DC

## 2018-08-18 NOTE — Progress Notes (Signed)
[facility] [place], [place] [address]  Internal MEDICINE  Office Visit Note  Patient Name: Rebecca Henry  [id number]  [id number]  Date of Service: 08/24/2018  Chief Complaint  Patient presents with  . Medical Management of Chronic Issues    follow up  . Labs Only    lab results    The patient is here for follow up visit. She recently had labs drawn. Her cholesterol panel was moderately elevated and her glucose level was over 300 and HgbA1c was 9.1. on prior labs, she has had high cholesterol and admits she has not been taking previously prescribed atorvastatin. Her HgbA1c has shown her to be at risk for diabetes, but has not been over 7.0.      Current Medication: Outpatient Encounter Medications as of 08/18/2018  Medication Sig  . ALPRAZolam (XANAX) 0.25 MG tablet Take 1 tablet (0.25 mg total) by mouth at bedtime as needed for anxiety (weeknights after 6pm).  Marland Kitchen atenolol (TENORMIN) 25 MG tablet Take one tablet by mouth daily  . blood glucose meter kit and supplies KIT Dispense based on patient and insurance preference. Use up to four times daily as directed. (FOR ICD-9 250.00, 250.01).  . cetirizine (ZYRTEC) 5 MG tablet Take 5 mg by mouth daily.  . chlorpheniramine (CHLOR-TRIMETON) 4 MG tablet Take 4 mg by mouth daily.  . cyclobenzaprine (FLEXERIL) 5 MG tablet Take 1 tablet (5 mg total) by mouth 2 (two) times daily as needed.  Marland Kitchen ibuprofen (ADVIL,MOTRIN) 600 MG tablet Take 1 tablet (600 mg total) by mouth every 6 (six) hours as needed for moderate pain.  Marland Kitchen loratadine (CLARITIN) 10 MG tablet Take 10 mg by mouth daily.  . meloxicam (MOBIC) 15 MG tablet Take 1 tablet (15 mg total) by mouth daily.  . metFORMIN (GLUCOPHAGE) 500 MG tablet Take 1 tablet (500 mg total) by mouth 2 (two) times daily with a meal.  . [DISCONTINUED] ibuprofen (ADVIL,MOTRIN) 600 MG tablet Take 1 tablet (600 mg total) by mouth every 6 (six) hours as needed.  . [DISCONTINUED] ibuprofen  (ADVIL,MOTRIN) 600 MG tablet Take 1 tablet (600 mg total) by mouth every 6 (six) hours as needed for moderate pain.  . [DISCONTINUED] metFORMIN (GLUCOPHAGE) 500 MG tablet Take 1 tablet (500 mg total) by mouth 2 (two) times daily with a meal.  . [DISCONTINUED] metFORMIN (GLUCOPHAGE) 500 MG tablet Take 1 tablet (500 mg total) by mouth 2 (two) times daily with a meal.  . atorvastatin (LIPITOR) 20 MG tablet Take 1 tablet (20 mg total) by mouth daily.  Marland Kitchen FLUoxetine (PROZAC) 10 MG tablet Take 1 tablet (10 mg total) by mouth daily.   No facility-administered encounter medications on file as of 08/18/2018.     Surgical History: Past Surgical History:  Procedure Laterality Date  . CESAREAN SECTION      Medical History: Past Medical History:  Diagnosis Date  . Allergic rhinitis   . Anxiety   . Hyperlipidemia   . Sleep apnea     Family History: Family History  Problem Relation Age of Onset  . Hypertension Mother   . Diabetes Maternal Grandmother   . Hypothyroidism Maternal Grandmother     Social History   Socioeconomic History  . Marital status: Single    Spouse name: Not on file  . Number of children: Not on file  . Years of education: Not on file  . Highest education level: Not on file  Occupational History  . Not on file  Social Needs  . Financial resource strain: Not on file  . Food insecurity:    Worry: Not on file    Inability: Not on file  . Transportation needs:    Medical: Not on file    Non-medical: Not on file  Tobacco Use  . Smoking status: Never Smoker  . Smokeless tobacco: Never Used  Substance and Sexual Activity  . Alcohol use: No  . Drug use: No  . Sexual activity: Not on file  Lifestyle  . Physical activity:    Days per week: Not on file    Minutes per session: Not on file  . Stress: Not on file  Relationships  . Social connections:    Talks on phone: Not on file    Gets together: Not on file    Attends religious service: Not on file    Active  member of club or organization: Not on file    Attends meetings of clubs or organizations: Not on file    Relationship status: Not on file  . Intimate partner violence:    Fear of current or ex partner: Not on file    Emotionally abused: Not on file    Physically abused: Not on file    Forced sexual activity: Not on file  Other Topics Concern  . Not on file  Social History Narrative  . Not on file      Review of Systems  Constitutional: Positive for fatigue. Negative for activity change, chills and unexpected weight change.  HENT: Negative for congestion, ear pain, postnasal drip, rhinorrhea, sneezing, sore throat and voice change.   Respiratory: Negative for cough, chest tightness and shortness of breath.   Cardiovascular: Negative for chest pain and palpitations.  Gastrointestinal: Negative for abdominal pain, constipation, diarrhea, nausea and vomiting.  Endocrine: Positive for polydipsia and polyuria. Negative for cold intolerance, heat intolerance and polyphagia.       Recent labs indicate that HgbA1c is 9.1  Genitourinary: Positive for frequency.  Musculoskeletal: Positive for back pain and myalgias. Negative for arthralgias, joint swelling and neck pain.  Skin: Negative for rash.  Allergic/Immunologic: Positive for environmental allergies.  Neurological: Positive for headaches. Negative for dizziness, tremors and numbness.  Hematological: Negative for adenopathy. Does not bruise/bleed easily.  Psychiatric/Behavioral: Positive for dysphoric mood. Negative for behavioral problems (Depression), sleep disturbance and suicidal ideas. The patient is nervous/anxious.     Today's Vitals   08/18/18 1534  BP: 136/77  Pulse: 87  Resp: 16  SpO2: 95%  Weight: 259 lb (117.5 kg)  Height: 5' 2.5" (1.588 m)     Physical Exam Vitals signs and nursing note reviewed.  Constitutional:      General: She is not in acute distress.    Appearance: She is well-developed. She is obese. She  is not diaphoretic.  HENT:     Head: Normocephalic and atraumatic.     Mouth/Throat:     Pharynx: No oropharyngeal exudate.  Eyes:     Pupils: Pupils are equal, round, and reactive to light.  Neck:     Musculoskeletal: Normal range of motion and neck supple.     Thyroid: No thyromegaly.     Vascular: No JVD.     Trachea: No tracheal deviation.  Cardiovascular:     Rate and Rhythm: Normal rate and regular rhythm.     Pulses: Normal pulses.     Heart sounds: Normal heart sounds. No murmur. No friction rub. No gallop.   Pulmonary:  Effort: Pulmonary effort is normal. No respiratory distress.     Breath sounds: Normal breath sounds. No wheezing or rales.  Chest:     Chest wall: No tenderness.  Abdominal:     General: Bowel sounds are normal.     Palpations: Abdomen is soft.  Musculoskeletal: Normal range of motion.  Lymphadenopathy:     Cervical: No cervical adenopathy.  Skin:    General: Skin is warm and dry.  Neurological:     Mental Status: She is alert and oriented to person, place, and time.     Cranial Nerves: No cranial nerve deficit.  Psychiatric:        Attention and Perception: Attention and perception normal.        Mood and Affect: Mood is anxious.        Speech: Speech normal.        Behavior: Behavior normal. Behavior is cooperative.        Thought Content: Thought content normal.        Cognition and Memory: Cognition and memory normal.        Judgment: Judgment normal.    Assessment/Plan: 1. Type 2 diabetes mellitus with hyperglycemia, without long-term current use of insulin (HCC) Recent labs showing HgbA1c 9.1. start metformin '500mg'$  bid. Diabetic diet discussed and written information provided. Instructions provided for her to check blood sugars daily. Referral to lifestyle center for new diabetic education provided today.  - metFORMIN (GLUCOPHAGE) 500 MG tablet; Take 1 tablet (500 mg total) by mouth 2 (two) times daily with a meal.  Dispense: 60 tablet;  Refill: 1 - Ambulatory referral to diabetic education  2. Mixed hyperlipidemia Restart atorvastatin '20mg'$  daily. Heart healthy, prudent diet discussed and information provided. Recheck lipid panel in 3 to 4 months.  - atorvastatin (LIPITOR) 20 MG tablet; Take 1 tablet (20 mg total) by mouth daily.  Dispense: 30 tablet; Refill: 3  3. Primary osteoarthritis of left shoulder May take ibuprofen '600mg'$  up to three times daily as needed for pain/inflammation. - ibuprofen (ADVIL,MOTRIN) 600 MG tablet; Take 1 tablet (600 mg total) by mouth every 6 (six) hours as needed for moderate pain.  Dispense: 20 tablet; Refill: 0  4. Generalized anxiety disorder with panic attacks May take alprazolam as needed and as prescribed .  General Counseling: lolly glaus understanding of the findings of todays visit and agrees with plan of treatment. I have discussed any further diagnostic evaluation that may be needed or ordered today. We also reviewed her medications today. she has been encouraged to call the office with any questions or concerns that should arise related to todays visit.  Diabetes Counseling:  1. Addition of ACE inh/ ARB'S for nephroprotection. Microalbumin is updated  2. Diabetic foot care, prevention of complications. Podiatry consult 3. Exercise and lose weight.  4. Diabetic eye examination, Diabetic eye exam is updated  5. Monitor blood sugar closlely. nutrition counseling.  6. Sign and symptoms of hypoglycemia including shaking sweating,confusion and headaches.  This patient was seen by Leretha Pol FNP Collaboration with Dr Lavera Guise as a part of collaborative care agreement  Orders Placed This Encounter  Procedures  . Ambulatory referral to diabetic education    Meds ordered this encounter  Medications  . atorvastatin (LIPITOR) 20 MG tablet    Sig: Take 1 tablet (20 mg total) by mouth daily.    Dispense:  30 tablet    Refill:  3    Order Specific Question:   Supervising  Provider  Answer:   Lavera Guise [place]  . DISCONTD: metFORMIN (GLUCOPHAGE) 500 MG tablet    Sig: Take 1 tablet (500 mg total) by mouth 2 (two) times daily with a meal.    Dispense:  60 tablet    Refill:  1    Order Specific Question:   Supervising Provider    Answer:   Lavera Guise [place]  . DISCONTD: ibuprofen (ADVIL,MOTRIN) 600 MG tablet    Sig: Take 1 tablet (600 mg total) by mouth every 6 (six) hours as needed for moderate pain.    Dispense:  20 tablet    Refill:  0    Order Specific Question:   Supervising Provider    Answer:   Lavera Guise [[id number]]  . ibuprofen (ADVIL,MOTRIN) 600 MG tablet    Sig: Take 1 tablet (600 mg total) by mouth every 6 (six) hours as needed for moderate pain.    Dispense:  20 tablet    Refill:  0    Order Specific Question:   Supervising Provider    Answer:   Lavera Guise [[id number]]  . metFORMIN (GLUCOPHAGE) 500 MG tablet    Sig: Take 1 tablet (500 mg total) by mouth 2 (two) times daily with a meal.    Dispense:  60 tablet    Refill:  1    Order Specific Question:   Supervising Provider    Answer:   Lavera Guise [[id number]]    Time spent: 25 Minutes      Dr Lavera Guise Internal medicine

## 2018-08-24 DIAGNOSIS — M19012 Primary osteoarthritis, left shoulder: Secondary | ICD-10-CM | POA: Insufficient documentation

## 2018-08-24 DIAGNOSIS — E1169 Type 2 diabetes mellitus with other specified complication: Secondary | ICD-10-CM | POA: Insufficient documentation

## 2018-08-24 DIAGNOSIS — E782 Mixed hyperlipidemia: Secondary | ICD-10-CM | POA: Insufficient documentation

## 2018-08-24 DIAGNOSIS — E1165 Type 2 diabetes mellitus with hyperglycemia: Secondary | ICD-10-CM | POA: Insufficient documentation

## 2018-09-22 ENCOUNTER — Other Ambulatory Visit: Payer: Self-pay

## 2018-09-22 ENCOUNTER — Encounter: Payer: Self-pay | Admitting: Adult Health

## 2018-09-22 ENCOUNTER — Ambulatory Visit: Payer: BLUE CROSS/BLUE SHIELD | Admitting: Adult Health

## 2018-09-22 VITALS — BP 122/89 | HR 80 | Temp 97.6°F | Resp 16 | Ht 62.0 in | Wt 254.0 lb

## 2018-09-22 DIAGNOSIS — M545 Low back pain, unspecified: Secondary | ICD-10-CM

## 2018-09-22 DIAGNOSIS — R300 Dysuria: Secondary | ICD-10-CM

## 2018-09-22 DIAGNOSIS — I10 Essential (primary) hypertension: Secondary | ICD-10-CM

## 2018-09-22 LAB — POCT URINALYSIS DIPSTICK
Bilirubin, UA: NEGATIVE
Blood, UA: NEGATIVE
GLUCOSE UA: NEGATIVE
Ketones, UA: NEGATIVE
Leukocytes, UA: NEGATIVE
Nitrite, UA: NEGATIVE
Protein, UA: NEGATIVE
Spec Grav, UA: 1.01 (ref 1.010–1.025)
Urobilinogen, UA: 0.2 E.U./dL
pH, UA: 5 (ref 5.0–8.0)

## 2018-09-22 MED ORDER — PREDNISONE 10 MG PO TABS: ORAL_TABLET | ORAL | 0 refills | Status: DC

## 2018-09-22 NOTE — Progress Notes (Signed)
[facility] [place], [place] [address]  Internal MEDICINE  Office Visit Note  Patient Name: Rebecca Henry  [id number]  [id number]  Date of Service: 09/22/2018  Chief Complaint  Patient presents with  . Back Pain    all of a sudden on Saturday , got out of car to pump gas the pain started in the lower back to the front of thigh     HPI Pt is here for a sick visit. Pt reports a history of back issues.  Saturday morning was at gas station and when she got out of the car she started her in her low back pain.  She reports the pain radiates around to the tops of her thighs.  She reports the pain feels like something "catching".   Current Medication:  Outpatient Encounter Medications as of 09/22/2018  Medication Sig  . ALPRAZolam (XANAX) 0.25 MG tablet Take 1 tablet (0.25 mg total) by mouth at bedtime as needed for anxiety (weeknights after 6pm).  Marland Kitchen atenolol (TENORMIN) 25 MG tablet Take one tablet by mouth daily  . atorvastatin (LIPITOR) 20 MG tablet Take 1 tablet (20 mg total) by mouth daily.  . blood glucose meter kit and supplies KIT Dispense based on patient and insurance preference. Use up to four times daily as directed. (FOR ICD-9 250.00, 250.01).  . cetirizine (ZYRTEC) 5 MG tablet Take 5 mg by mouth daily.  . chlorpheniramine (CHLOR-TRIMETON) 4 MG tablet Take 4 mg by mouth daily.  . cyclobenzaprine (FLEXERIL) 5 MG tablet Take 1 tablet (5 mg total) by mouth 2 (two) times daily as needed.  Marland Kitchen FLUoxetine (PROZAC) 10 MG tablet Take 1 tablet (10 mg total) by mouth daily.  Marland Kitchen ibuprofen (ADVIL,MOTRIN) 600 MG tablet Take 1 tablet (600 mg total) by mouth every 6 (six) hours as needed for moderate pain.  Marland Kitchen loratadine (CLARITIN) 10 MG tablet Take 10 mg by mouth daily.  . metFORMIN (GLUCOPHAGE) 500 MG tablet Take 1 tablet (500 mg total) by mouth 2 (two) times daily with a meal. (Patient taking differently: Take 500 mg by mouth 2 (two) times daily with a meal. Patient  is taken one a day due to upset stomach)  . meloxicam (MOBIC) 15 MG tablet Take 1 tablet (15 mg total) by mouth daily. (Patient not taking: Reported on 09/22/2018)   No facility-administered encounter medications on file as of 09/22/2018.       Medical History: Past Medical History:  Diagnosis Date  . Allergic rhinitis   . Anxiety   . Hyperlipidemia   . Sleep apnea      Vital Signs: BP 122/89 (BP Location: Left Arm, Patient Position: Sitting, Cuff Size: Normal)   Pulse 80   Temp 97.6 F (36.4 C) (Oral)   Resp 16   Ht '5\' 2"'$  (1.575 m)   Wt 254 lb (115.2 kg)   SpO2 94%   BMI 46.46 kg/m    Review of Systems  Constitutional: Negative for chills, fatigue and unexpected weight change.  HENT: Negative for congestion, rhinorrhea, sneezing and sore throat.   Eyes: Negative for photophobia, pain and redness.  Respiratory: Negative for cough, chest tightness and shortness of breath.   Cardiovascular: Negative for chest pain and palpitations.  Gastrointestinal: Negative for abdominal pain, constipation, diarrhea, nausea and vomiting.  Endocrine: Negative.   Genitourinary: Negative for dysuria and frequency.  Musculoskeletal: Negative for arthralgias, back pain, joint swelling and neck pain.  Skin: Negative for rash.  Allergic/Immunologic: Negative.  Neurological: Negative for tremors and numbness.  Hematological: Negative for adenopathy. Does not bruise/bleed easily.  Psychiatric/Behavioral: Negative for behavioral problems and sleep disturbance. The patient is not nervous/anxious.     Physical Exam Vitals signs and nursing note reviewed.  Constitutional:      General: She is not in acute distress.    Appearance: She is well-developed. She is not diaphoretic.  HENT:     Head: Normocephalic and atraumatic.     Mouth/Throat:     Pharynx: No oropharyngeal exudate.  Eyes:     Pupils: Pupils are equal, round, and reactive to light.  Neck:     Musculoskeletal: Normal range of  motion and neck supple.     Thyroid: No thyromegaly.     Vascular: No JVD.     Trachea: No tracheal deviation.  Cardiovascular:     Rate and Rhythm: Normal rate and regular rhythm.     Heart sounds: Normal heart sounds. No murmur. No friction rub. No gallop.   Pulmonary:     Effort: Pulmonary effort is normal. No respiratory distress.     Breath sounds: Normal breath sounds. No wheezing or rales.  Chest:     Chest wall: No tenderness.  Abdominal:     Palpations: Abdomen is soft.     Tenderness: There is no abdominal tenderness. There is no guarding.  Musculoskeletal: Normal range of motion.  Lymphadenopathy:     Cervical: No cervical adenopathy.  Skin:    General: Skin is warm and dry.  Neurological:     Mental Status: She is alert and oriented to person, place, and time.     Cranial Nerves: No cranial nerve deficit.  Psychiatric:        Behavior: Behavior normal.        Thought Content: Thought content normal.        Judgment: Judgment normal.    Assessment/Plan: 1. Acute bilateral low back pain without sciatica Patient instructed to use heat for 20 minutes on and 2 hours off intermittently including her anti-inflammatory medications.  Also a prescription for prednisone to help with inflammation.  Instructed patient that if symptoms do not improve in the next week that she should return to clinic or call and schedule x-rays. - predniSONE (DELTASONE) 10 MG tablet; Use per dose pack  Dispense: 21 tablet; Refill: 0  2. Essential hypertension Stable, continue current medications as prescribed.  3. Morbid obesity ([place]) Obesity Counseling: Risk Assessment: An assessment of behavioral risk factors was made today and includes lack of exercise sedentary lifestyle, lack of portion control and poor dietary habits.  Risk Modification Advice: She was counseled on portion control guidelines. Restricting daily caloric intake to. . The detrimental long term effects of obesity on her health  and ongoing poor compliance was also discussed with the patient.  4. Dysuria Urine dip negative. - POCT Urinalysis Dipstick  General Counseling: aizlyn schifano understanding of the findings of todays visit and agrees with plan of treatment. I have discussed any further diagnostic evaluation that may be needed or ordered today. We also reviewed her medications today. she has been encouraged to call the office with any questions or concerns that should arise related to todays visit.   Orders Placed This Encounter  Procedures  . POCT Urinalysis Dipstick    No orders of the defined types were placed in this encounter.   Time spent: 20 Minutes  This patient was seen by Orson Gear AGNP-C in Collaboration with Dr Lavera Guise  as a part of collaborative care agreement.  Kendell Bane AGNP-C Internal Medicine

## 2018-09-22 NOTE — Patient Instructions (Signed)
Acute Back Pain, Adult  Acute back pain is sudden and usually short-lived. It is often caused by an injury to the muscles and tissues in the back. The injury may result from:   A muscle or ligament getting overstretched or torn (strained). Ligaments are tissues that connect bones to each other. Lifting something improperly can cause a back strain.   Wear and tear (degeneration) of the spinal disks. Spinal disks are circular tissue that provides cushioning between the bones of the spine (vertebrae).   Twisting motions, such as while playing sports or doing yard work.   A hit to the back.   Arthritis.  You may have a physical exam, lab tests, and imaging tests to find the cause of your pain. Acute back pain usually goes away with rest and home care.  Follow these instructions at home:  Managing pain, stiffness, and swelling   Take over-the-counter and prescription medicines only as told by your health care provider.   Your health care provider may recommend applying ice during the first 24-48 hours after your pain starts. To do this:  ? Put ice in a plastic bag.  ? Place a towel between your skin and the bag.  ? Leave the ice on for 20 minutes, 2-3 times a day.   If directed, apply heat to the affected area as often as told by your health care provider. Use the heat source that your health care provider recommends, such as a moist heat pack or a heating pad.  ? Place a towel between your skin and the heat source.  ? Leave the heat on for 20-30 minutes.  ? Remove the heat if your skin turns bright red. This is especially important if you are unable to feel pain, heat, or cold. You have a greater risk of getting burned.  Activity     Do not stay in bed. Staying in bed for more than 1-2 days can delay your recovery.   Sit up and stand up straight. Avoid leaning forward when you sit, or hunching over when you stand.  ? If you work at a desk, sit close to it so you do not need to lean over. Keep your chin tucked  in. Keep your neck drawn back, and keep your elbows bent at a right angle. Your arms should look like the letter "L."  ? Sit high and close to the steering wheel when you drive. Add lower back (lumbar) support to your car seat, if needed.   Take short walks on even surfaces as soon as you are able. Try to increase the length of time you walk each day.   Do not sit, drive, or stand in one place for more than 30 minutes at a time. Sitting or standing for long periods of time can put stress on your back.   Do not drive or use heavy machinery while taking prescription pain medicine.   Use proper lifting techniques. When you bend and lift, use positions that put less stress on your back:  ? Bend your knees.  ? Keep the load close to your body.  ? Avoid twisting.   Exercise regularly as told by your health care provider. Exercising helps your back heal faster and helps prevent back injuries by keeping muscles strong and flexible.   Work with a physical therapist to make a safe exercise program, as recommended by your health care provider. Do any exercises as told by your physical therapist.  Lifestyle   Maintain   a healthy weight. Extra weight puts stress on your back and makes it difficult to have good posture.   Avoid activities or situations that make you feel anxious or stressed. Stress and anxiety increase muscle tension and can make back pain worse. Learn ways to manage anxiety and stress, such as through exercise.  General instructions   Sleep on a firm mattress in a comfortable position. Try lying on your side with your knees slightly bent. If you lie on your back, put a pillow under your knees.   Follow your treatment plan as told by your health care provider. This may include:  ? Cognitive or behavioral therapy.  ? Acupuncture or massage therapy.  ? Meditation or yoga.  Contact a health care provider if:   You have pain that is not relieved with rest or medicine.   You have increasing pain going down  into your legs or buttocks.   Your pain does not improve after 2 weeks.   You have pain at night.   You lose weight without trying.   You have a fever or chills.  Get help right away if:   You develop new bowel or bladder control problems.   You have unusual weakness or numbness in your arms or legs.   You develop nausea or vomiting.   You develop abdominal pain.   You feel faint.  Summary   Acute back pain is sudden and usually short-lived.   Use proper lifting techniques. When you bend and lift, use positions that put less stress on your back.   Take over-the-counter and prescription medicines and apply heat or ice as directed by your health care provider.  This information is not intended to replace advice given to you by your health care provider. Make sure you discuss any questions you have with your health care provider.  Document Released: 07/23/2005 Document Revised: 02/27/2018 Document Reviewed: 03/06/2017  [employer]vier Interactive Patient Education  2019 [employer]

## 2018-10-09 ENCOUNTER — Encounter: Payer: Self-pay | Admitting: Nurse Practitioner

## 2018-10-09 ENCOUNTER — Ambulatory Visit: Payer: BLUE CROSS/BLUE SHIELD | Admitting: Nurse Practitioner

## 2018-10-09 ENCOUNTER — Other Ambulatory Visit: Payer: Self-pay

## 2018-10-09 VITALS — BP 132/80 | HR 86 | Temp 97.3°F | Resp 16 | Ht 62.25 in | Wt 253.6 lb

## 2018-10-09 DIAGNOSIS — J029 Acute pharyngitis, unspecified: Secondary | ICD-10-CM | POA: Diagnosis not present

## 2018-10-09 DIAGNOSIS — R519 Headache, unspecified: Secondary | ICD-10-CM | POA: Insufficient documentation

## 2018-10-09 DIAGNOSIS — R51 Headache: Secondary | ICD-10-CM

## 2018-10-09 DIAGNOSIS — J020 Streptococcal pharyngitis: Secondary | ICD-10-CM | POA: Insufficient documentation

## 2018-10-09 LAB — POCT RAPID STREP A (OFFICE): RAPID STREP A SCREEN: POSITIVE — AB

## 2018-10-09 LAB — POCT INFLUENZA A/B
Influenza A, POC: NEGATIVE
Influenza B, POC: NEGATIVE

## 2018-10-09 MED ORDER — AMOXICILLIN 875 MG PO TABS: 875.0000 mg | ORAL_TABLET | Freq: Two times a day (BID) | ORAL | 0 refills | Status: DC

## 2018-10-09 NOTE — Progress Notes (Signed)
[facility] [place], [place] [address]  Internal MEDICINE  Office Visit Note  Patient Name: Rebecca Henry  [id number]  [id number]  Date of Service: 10/09/2018   Pt is here for a sick visit.   Chief Complaint  Patient presents with  . Sore Throat    started thursday evening, felt like it was on fire, slightly ease when she take tylenol, has taken OTC and not working, pt has felt lushed not sure if she has had a fever or anything, feels more like sinus issues,   . Headache     Patient has sore throat with headache which started Tuesday evening. Has taken tylenol and ibuprofen since she started having symptoms. Not really helping. Does work in a [employer]. Does not have fever or nausea and vomiting.       Current Medication:  Outpatient Encounter Medications as of 10/09/2018  Medication Sig  . ALPRAZolam (XANAX) 0.25 MG tablet Take 1 tablet (0.25 mg total) by mouth at bedtime as needed for anxiety (weeknights after 6pm).  Marland Kitchen atenolol (TENORMIN) 25 MG tablet Take one tablet by mouth daily  . atorvastatin (LIPITOR) 20 MG tablet Take 1 tablet (20 mg total) by mouth daily.  . blood glucose meter kit and supplies KIT Dispense based on patient and insurance preference. Use up to four times daily as directed. (FOR ICD-9 250.00, 250.01).  . cetirizine (ZYRTEC) 5 MG tablet Take 5 mg by mouth daily.  . chlorpheniramine (CHLOR-TRIMETON) 4 MG tablet Take 4 mg by mouth daily.  . cyclobenzaprine (FLEXERIL) 5 MG tablet Take 1 tablet (5 mg total) by mouth 2 (two) times daily as needed.  Marland Kitchen FLUoxetine (PROZAC) 10 MG tablet Take 1 tablet (10 mg total) by mouth daily.  Marland Kitchen ibuprofen (ADVIL,MOTRIN) 600 MG tablet Take 1 tablet (600 mg total) by mouth every 6 (six) hours as needed for moderate pain.  Marland Kitchen loratadine (CLARITIN) 10 MG tablet Take 10 mg by mouth daily.  . metFORMIN (GLUCOPHAGE) 500 MG tablet Take 1 tablet (500 mg total) by mouth 2 (two) times daily with a meal.  (Patient taking differently: Take 500 mg by mouth 2 (two) times daily with a meal. Patient is taken one a day due to upset stomach)  . predniSONE (DELTASONE) 10 MG tablet Use per dose pack  . amoxicillin (AMOXIL) 875 MG tablet Take 1 tablet (875 mg total) by mouth 2 (two) times daily.  . [DISCONTINUED] meloxicam (MOBIC) 15 MG tablet Take 1 tablet (15 mg total) by mouth daily. (Patient not taking: Reported on 09/22/2018)   No facility-administered encounter medications on file as of 10/09/2018.       Medical History: Past Medical History:  Diagnosis Date  . Allergic rhinitis   . Anxiety   . Hyperlipidemia   . Sleep apnea     Today's Vitals   10/09/18 0918  BP: 132/80  Pulse: 86  Resp: 16  Temp: (!) 97.3 F (36.3 C)  SpO2: 95%  Weight: 253 lb 9.6 oz (115 kg)  Height: 5' 2.25" (1.581 m)   Body mass index is 46.01 kg/m.  Review of Systems  Constitutional: Positive for chills, fatigue and fever.  HENT: Positive for congestion, sore throat and voice change. Negative for ear pain, postnasal drip, rhinorrhea and sinus pain.   Respiratory: Negative for cough and wheezing.   Cardiovascular: Negative for chest pain and palpitations.  Musculoskeletal: Positive for myalgias.  Skin: Negative for rash.  Neurological: Positive for headaches.  Hematological:  Positive for adenopathy.    Physical Exam Vitals signs and nursing note reviewed.  Constitutional:      General: She is not in acute distress.    Appearance: She is well-developed. She is ill-appearing. She is not diaphoretic.  HENT:     Head: Normocephalic and atraumatic.     Right Ear: Tympanic membrane is bulging.     Left Ear: Tympanic membrane is bulging.     Mouth/Throat:     Pharynx: Posterior oropharyngeal erythema present. No oropharyngeal exudate.  Eyes:     Pupils: Pupils are equal, round, and reactive to light.  Neck:     Musculoskeletal: Normal range of motion and neck supple.     Thyroid: No thyromegaly.      Vascular: No JVD.     Trachea: No tracheal deviation.  Cardiovascular:     Rate and Rhythm: Normal rate and regular rhythm.     Heart sounds: Normal heart sounds. No murmur. No friction rub. No gallop.   Pulmonary:     Effort: Pulmonary effort is normal. No respiratory distress.     Breath sounds: No wheezing or rales.  Chest:     Chest wall: No tenderness.  Abdominal:     General: Bowel sounds are normal.     Palpations: Abdomen is soft.  Musculoskeletal: Normal range of motion.  Lymphadenopathy:     Cervical: Cervical adenopathy present.  Skin:    General: Skin is warm and dry.  Neurological:     Mental Status: She is alert and oriented to person, place, and time.     Cranial Nerves: No cranial nerve deficit.  Psychiatric:        Behavior: Behavior normal.        Thought Content: Thought content normal.        Judgment: Judgment normal.    Assessment/Plan: 1. Sore throat Strep swab positive. Patient negative for flu.  - POCT Influenza A/B - POCT rapid strep A  2. Strep throat Treat with amoxicillin '875mg'$  bid for 10 days. Recommend tylenol and ibuprofen for pain and fever. Rest. Recommend she stay out of work for next 24 to 48 hours. Gargle with warm salt water to help with throat pain.  - amoxicillin (AMOXIL) 875 MG tablet; Take 1 tablet (875 mg total) by mouth 2 (two) times daily.  Dispense: 20 tablet; Refill: 0  3. Nonintractable headache, unspecified chronicity pattern, unspecified headache type Positive for strep throat. Treat with amoxicillin '875mg'$  bid for 10 days. Recommend tylenol and ibuprofen for pain and fever. Rest. Recommend she stay out of work for next 24 to 48 hours. Gargle with warm salt water to help with throat pain.  - POCT Influenza A/B - POCT rapid strep A  General Counseling: Rebecca Henry verbalizes understanding of the findings of todays visit and agrees with plan of treatment. I have discussed any further diagnostic evaluation that may be needed or ordered  today. We also reviewed her medications today. she has been encouraged to call the office with any questions or concerns that should arise related to todays visit.    Counseling:  Rest and increase fluids. Continue using OTC medication to control symptoms.   This patient was seen by Leretha Pol FNP Collaboration with Dr Lavera Guise as a part of collaborative care agreement  Orders Placed This Encounter  Procedures  . POCT Influenza A/B  . POCT rapid strep A    Meds ordered this encounter  Medications  . amoxicillin (AMOXIL) 875 MG  tablet    Sig: Take 1 tablet (875 mg total) by mouth 2 (two) times daily.    Dispense:  20 tablet    Refill:  0    Order Specific Question:   Supervising Provider    Answer:   Lavera Guise [[id number]]    Time spent: 25 Minutes

## 2018-11-03 ENCOUNTER — Other Ambulatory Visit: Payer: Self-pay

## 2018-11-03 ENCOUNTER — Other Ambulatory Visit: Payer: Self-pay | Admitting: Nurse Practitioner

## 2018-11-03 MED ORDER — ATENOLOL 25 MG PO TABS: ORAL_TABLET | ORAL | 3 refills | Status: DC

## 2018-11-12 ENCOUNTER — Telehealth: Payer: Self-pay

## 2018-11-12 NOTE — Telephone Encounter (Signed)
Pt advised labs slip ready for pickup

## 2018-11-12 NOTE — Telephone Encounter (Signed)
-----   Message from Golda Acre sent at 11/11/2018  4:14 PM EDT ----- Pt called that see need labs for comp meta and lipid panel e78.2 and fasting

## 2018-11-18 ENCOUNTER — Other Ambulatory Visit: Payer: Self-pay

## 2018-11-18 ENCOUNTER — Ambulatory Visit: Payer: BLUE CROSS/BLUE SHIELD | Admitting: Nurse Practitioner

## 2018-11-18 ENCOUNTER — Encounter: Payer: Self-pay | Admitting: Nurse Practitioner

## 2018-11-18 DIAGNOSIS — F411 Generalized anxiety disorder: Secondary | ICD-10-CM

## 2018-11-18 DIAGNOSIS — E1165 Type 2 diabetes mellitus with hyperglycemia: Secondary | ICD-10-CM | POA: Diagnosis not present

## 2018-11-18 DIAGNOSIS — E782 Mixed hyperlipidemia: Secondary | ICD-10-CM

## 2018-11-18 DIAGNOSIS — I10 Essential (primary) hypertension: Secondary | ICD-10-CM

## 2018-11-18 DIAGNOSIS — F410 Panic disorder [episodic paroxysmal anxiety] without agoraphobia: Secondary | ICD-10-CM

## 2018-11-18 NOTE — Progress Notes (Signed)
[facility] [place], [place] [address]  Internal MEDICINE  Telephone Visit  Patient Name: Rebecca Henry  [id number]  [id number]  Date of Service: 12/03/2018  I connected with the patient at 3:20pm by webcam and verified the patients identity using two identifiers.   I discussed the limitations, risks, security and privacy concerns of performing an evaluation and management service by webcam and the availability of in person appointments. I also discussed with the patient that there may be a patient responsible charge related to the service.  The patient expressed understanding and agrees to proceed.    Chief Complaint  Patient presents with  . Telephone Assessment  . Telephone Screen  . Hyperlipidemia  . Anxiety    The patient has been contacted via webcam for follow up visit due to concerns for spread of novel coronavirus. Currently taking metformin '500mg'$  daily. Taking second dose of metformin causes severe nausea and fatigue. She states that her weight has increased, but she has also been under increased stress regarding work. During increased stress, she will generally take alprazolam 0.'25mg'$  tablet, but taking 1/2 tablet at a time. Will help take edge out of her overall anxiety.       Current Medication: Outpatient Encounter Medications as of 11/18/2018  Medication Sig Note  . ALPRAZolam (XANAX) 0.25 MG tablet Take 1 tablet (0.25 mg total) by mouth at bedtime as needed for anxiety (weeknights after 6pm).   Marland Kitchen atenolol (TENORMIN) 25 MG tablet Take one tablet by mouth daily   . atorvastatin (LIPITOR) 20 MG tablet Take 1 tablet (20 mg total) by mouth daily.   . blood glucose meter kit and supplies KIT Dispense based on patient and insurance preference. Use up to four times daily as directed. (FOR ICD-9 250.00, 250.01).   . cetirizine (ZYRTEC) 5 MG tablet Take 5 mg by mouth daily.   . chlorpheniramine (CHLOR-TRIMETON) 4 MG tablet Take 4 mg by mouth daily.    . cyclobenzaprine (FLEXERIL) 5 MG tablet Take 1 tablet (5 mg total) by mouth 2 (two) times daily as needed.   Marland Kitchen ibuprofen (ADVIL,MOTRIN) 600 MG tablet Take 1 tablet (600 mg total) by mouth every 6 (six) hours as needed for moderate pain.   Marland Kitchen loratadine (CLARITIN) 10 MG tablet Take 10 mg by mouth daily.   . metFORMIN (GLUCOPHAGE) 500 MG tablet Take 1 tablet (500 mg total) by mouth 2 (two) times daily with a meal. (Patient taking differently: Take 500 mg by mouth 2 (two) times daily with a meal. Patient is taken one a day due to upset stomach)   . [DISCONTINUED] FLUoxetine (PROZAC) 10 MG tablet Take 1 tablet (10 mg total) by mouth daily. 11/18/2018: patient never started taking it.   . [DISCONTINUED] amoxicillin (AMOXIL) 875 MG tablet Take 1 tablet (875 mg total) by mouth 2 (two) times daily. (Patient not taking: Reported on 11/18/2018)   . [DISCONTINUED] predniSONE (DELTASONE) 10 MG tablet Use per dose pack (Patient not taking: Reported on 11/18/2018)    No facility-administered encounter medications on file as of 11/18/2018.     Surgical History: Past Surgical History:  Procedure Laterality Date  . CESAREAN SECTION      Medical History: Past Medical History:  Diagnosis Date  . Allergic rhinitis   . Anxiety   . Hyperlipidemia   . Sleep apnea     Family History: Family History  Problem Relation Age of Onset  . Hypertension Mother   . Diabetes Maternal Grandmother   .  Hypothyroidism Maternal Grandmother     Social History   Socioeconomic History  . Marital status: Single    Spouse name: Not on file  . Number of children: Not on file  . Years of education: Not on file  . Highest education level: Not on file  Occupational History  . Not on file  Social Needs  . Financial resource strain: Not on file  . Food insecurity:    Worry: Not on file    Inability: Not on file  . Transportation needs:    Medical: Not on file    Non-medical: Not on file  Tobacco Use  . Smoking  status: Never Smoker  . Smokeless tobacco: Never Used  Substance and Sexual Activity  . Alcohol use: No  . Drug use: No  . Sexual activity: Not on file  Lifestyle  . Physical activity:    Days per week: Not on file    Minutes per session: Not on file  . Stress: Not on file  Relationships  . Social connections:    Talks on phone: Not on file    Gets together: Not on file    Attends religious service: Not on file    Active member of club or organization: Not on file    Attends meetings of clubs or organizations: Not on file    Relationship status: Not on file  . Intimate partner violence:    Fear of current or ex partner: Not on file    Emotionally abused: Not on file    Physically abused: Not on file    Forced sexual activity: Not on file  Other Topics Concern  . Not on file  Social History Narrative  . Not on file      Review of Systems  Constitutional: Positive for fatigue and unexpected weight change. Negative for activity change and chills.       Increased weight.  HENT: Negative for congestion, ear pain, postnasal drip, rhinorrhea, sneezing, sore throat and voice change.   Respiratory: Negative for cough, chest tightness and shortness of breath.   Cardiovascular: Negative for chest pain and palpitations.  Gastrointestinal: Negative for abdominal pain, constipation, diarrhea, nausea and vomiting.  Endocrine: Positive for polydipsia and polyuria. Negative for cold intolerance, heat intolerance and polyphagia.  Musculoskeletal: Positive for back pain and myalgias. Negative for arthralgias, joint swelling and neck pain.  Skin: Negative for rash.  Allergic/Immunologic: Positive for environmental allergies.  Neurological: Positive for headaches. Negative for dizziness, tremors and numbness.  Hematological: Negative for adenopathy. Does not bruise/bleed easily.  Psychiatric/Behavioral: Positive for dysphoric mood. Negative for behavioral problems (Depression), sleep  disturbance and suicidal ideas. The patient is nervous/anxious.     Vital Signs: There were no vitals taken for this visit.   Observation/Objective:  The patient is alert and oriented. She is pleasant, however, she does appear worried. Breathing is no labored. She is in no acute distress at this time.    Assessment/Plan: 1. Type 2 diabetes mellitus with hyperglycemia, without long-term current use of insulin (HCC) Currently taking metformin '500mg'$  once daily, as twice daily caused severe nausea. Will check HgbA1c at her next in office visit and adjust medication as indicated.   2. Essential hypertension Historically stable. Continue bp medication as prescribed   3. Mixed hyperlipidemia Encouraged her to take atorvastatin as prescribed.   4. Generalized anxiety disorder with panic attacks May continue alprazolam 0.'25mg'$  as needed and as prescribed .  General Counseling: grete bosko understanding of the findings  of today's phone visit and agrees with plan of treatment. I have discussed any further diagnostic evaluation that may be needed or ordered today. We also reviewed her medications today. she has been encouraged to call the office with any questions or concerns that should arise related to todays visit.  Diabetes Counseling:  1. Addition of ACE inh/ ARB'S for nephroprotection. Microalbumin is updated  2. Diabetic foot care, prevention of complications. Podiatry consult 3. Exercise and lose weight.  4. Diabetic eye examination, Diabetic eye exam is updated  5. Monitor blood sugar closlely. nutrition counseling.  6. Sign and symptoms of hypoglycemia including shaking sweating,confusion and headaches.   This patient was seen by Leretha Pol FNP Collaboration with Dr Lavera Guise as a part of collaborative care agreement   Time spent: 11 Minutes    Dr Lavera Guise Internal medicine

## 2018-12-23 ENCOUNTER — Other Ambulatory Visit: Payer: Self-pay

## 2018-12-23 DIAGNOSIS — E1165 Type 2 diabetes mellitus with hyperglycemia: Secondary | ICD-10-CM

## 2018-12-23 DIAGNOSIS — E782 Mixed hyperlipidemia: Secondary | ICD-10-CM

## 2018-12-23 MED ORDER — ATORVASTATIN CALCIUM 20 MG PO TABS: 20.0000 mg | ORAL_TABLET | Freq: Every day | ORAL | 3 refills | Status: DC

## 2018-12-23 MED ORDER — METFORMIN HCL 500 MG PO TABS: 500.0000 mg | ORAL_TABLET | Freq: Two times a day (BID) | ORAL | 1 refills | Status: DC

## 2018-12-30 ENCOUNTER — Encounter: Payer: Self-pay | Admitting: Nurse Practitioner

## 2018-12-30 ENCOUNTER — Ambulatory Visit: Payer: BLUE CROSS/BLUE SHIELD | Admitting: Nurse Practitioner

## 2018-12-30 ENCOUNTER — Other Ambulatory Visit: Payer: Self-pay

## 2018-12-30 VITALS — BP 131/74 | HR 81 | Resp 16 | Ht 62.0 in | Wt 249.0 lb

## 2018-12-30 DIAGNOSIS — I10 Essential (primary) hypertension: Secondary | ICD-10-CM

## 2018-12-30 DIAGNOSIS — E1165 Type 2 diabetes mellitus with hyperglycemia: Secondary | ICD-10-CM | POA: Diagnosis not present

## 2018-12-30 DIAGNOSIS — F411 Generalized anxiety disorder: Secondary | ICD-10-CM

## 2018-12-30 DIAGNOSIS — F410 Panic disorder [episodic paroxysmal anxiety] without agoraphobia: Secondary | ICD-10-CM

## 2018-12-30 DIAGNOSIS — E782 Mixed hyperlipidemia: Secondary | ICD-10-CM | POA: Diagnosis not present

## 2018-12-30 LAB — POCT GLYCOSYLATED HEMOGLOBIN (HGB A1C): Hemoglobin A1C: 10.3 % — AB (ref 4.0–5.6)

## 2018-12-30 MED ORDER — SITAGLIP PHOS-METFORMIN HCL ER 50-500 MG PO TB24: 1.0000 | ORAL_TABLET | Freq: Every day | ORAL | 2 refills | Status: DC

## 2018-12-30 NOTE — Progress Notes (Signed)
[facility] [place], [place] [address]  Internal MEDICINE  Office Visit Note  Patient Name: Rebecca Henry  [id number]  [id number]  Date of Service: 01/12/2019  Chief Complaint  Patient presents with  . Follow-up  . Diabetes  . Hypertension  . Anxiety    The patient is here for follow up regarding diabetes. Currently taking metformin '500mg'$  daily. Taking second dose of metformin causes severe nausea and fatigue. She states that her weight has increased, but she has also been under increased stress regarding work. She states that she has made some major changes in regard to her diet. She is trying to follow diabetic diet information which was provided to her when she was first diagnosed. Has been unable to check blood sugars at home. Wanted to check with her insurance to see what sort of glucose meter her insurance would cover, and has not been able to do that yet. She states that she has been feeling ok. Denies increase in thirst or need to urinate. She denies headache or blurry vision. Check of HgbA1c is up today to 10.3. Patient is tearful about this as she felt like she was doing well.       Current Medication: Outpatient Encounter Medications as of 12/30/2018  Medication Sig Note  . ALPRAZolam (XANAX) 0.25 MG tablet Take 1 tablet (0.25 mg total) by mouth at bedtime as needed for anxiety (weeknights after 6pm).   Marland Kitchen atenolol (TENORMIN) 25 MG tablet Take one tablet by mouth daily   . atorvastatin (LIPITOR) 20 MG tablet Take 1 tablet (20 mg total) by mouth daily.   . blood glucose meter kit and supplies KIT Dispense based on patient and insurance preference. Use up to four times daily as directed. (FOR ICD-9 250.00, 250.01).   . cetirizine (ZYRTEC) 5 MG tablet Take 5 mg by mouth daily.   . chlorpheniramine (CHLOR-TRIMETON) 4 MG tablet Take 4 mg by mouth daily.   . cyclobenzaprine (FLEXERIL) 5 MG tablet Take 1 tablet (5 mg total) by mouth 2 (two) times daily as  needed.   Marland Kitchen ibuprofen (ADVIL,MOTRIN) 600 MG tablet Take 1 tablet (600 mg total) by mouth every 6 (six) hours as needed for moderate pain.   Marland Kitchen loratadine (CLARITIN) 10 MG tablet Take 10 mg by mouth daily.   . [DISCONTINUED] metFORMIN (GLUCOPHAGE) 500 MG tablet Take 1 tablet (500 mg total) by mouth 2 (two) times daily with a meal. 12/30/2018: start janumet and d/c metformin  . SitaGLIPtin-MetFORMIN HCl (JANUMET XR) 50-500 MG TB24 Take 1 tablet by mouth daily.   . [DISCONTINUED] SitaGLIPtin-MetFORMIN HCl (JANUMET XR) 50-500 MG TB24 Take 1 tablet by mouth daily.    No facility-administered encounter medications on file as of 12/30/2018.     Surgical History: Past Surgical History:  Procedure Laterality Date  . CESAREAN SECTION      Medical History: Past Medical History:  Diagnosis Date  . Allergic rhinitis   . Anxiety   . Hyperlipidemia   . Sleep apnea     Family History: Family History  Problem Relation Age of Onset  . Hypertension Mother   . Diabetes Maternal Grandmother   . Hypothyroidism Maternal Grandmother     Social History   Socioeconomic History  . Marital status: Single    Spouse name: Not on file  . Number of children: Not on file  . Years of education: Not on file  . Highest education level: Not on file  Occupational History  .  Not on file  Social Needs  . Financial resource strain: Not on file  . Food insecurity:    Worry: Not on file    Inability: Not on file  . Transportation needs:    Medical: Not on file    Non-medical: Not on file  Tobacco Use  . Smoking status: Never Smoker  . Smokeless tobacco: Never Used  Substance and Sexual Activity  . Alcohol use: Yes    Comment: very rarely  . Drug use: No  . Sexual activity: Not on file  Lifestyle  . Physical activity:    Days per week: Not on file    Minutes per session: Not on file  . Stress: Not on file  Relationships  . Social connections:    Talks on phone: Not on file    Gets together: Not  on file    Attends religious service: Not on file    Active member of club or organization: Not on file    Attends meetings of clubs or organizations: Not on file    Relationship status: Not on file  . Intimate partner violence:    Fear of current or ex partner: Not on file    Emotionally abused: Not on file    Physically abused: Not on file    Forced sexual activity: Not on file  Other Topics Concern  . Not on file  Social History Narrative  . Not on file      Review of Systems  Constitutional: Positive for fatigue and unexpected weight change. Negative for activity change and chills.       Increased weight.  HENT: Negative for congestion, ear pain, postnasal drip, rhinorrhea, sneezing, sore throat and voice change.   Respiratory: Negative for cough, chest tightness and shortness of breath.   Cardiovascular: Negative for chest pain and palpitations.  Gastrointestinal: Negative for abdominal pain, constipation, diarrhea, nausea and vomiting.  Endocrine: Negative for cold intolerance, heat intolerance, polydipsia and polyuria.  Musculoskeletal: Positive for back pain and myalgias. Negative for arthralgias, joint swelling and neck pain.  Skin: Negative for rash.  Allergic/Immunologic: Positive for environmental allergies.  Neurological: Positive for headaches. Negative for dizziness, tremors and numbness.  Hematological: Negative for adenopathy. Does not bruise/bleed easily.  Psychiatric/Behavioral: Positive for dysphoric mood. Negative for behavioral problems (Depression), sleep disturbance and suicidal ideas. The patient is nervous/anxious.    Today's Vitals   12/30/18 1604  BP: 131/74  Pulse: 81  Resp: 16  SpO2: 97%  Weight: 249 lb (112.9 kg)  Height: '5\' 2"'$  (1.575 m)   Body mass index is 45.54 kg/m.   Physical Exam Vitals signs and nursing note reviewed.  Constitutional:      General: She is not in acute distress.    Appearance: She is well-developed. She is obese.  She is not diaphoretic.  HENT:     Head: Normocephalic and atraumatic.     Mouth/Throat:     Pharynx: No oropharyngeal exudate.  Eyes:     Pupils: Pupils are equal, round, and reactive to light.  Neck:     Musculoskeletal: Normal range of motion and neck supple.     Thyroid: No thyromegaly.     Vascular: No JVD.     Trachea: No tracheal deviation.  Cardiovascular:     Rate and Rhythm: Normal rate and regular rhythm.     Pulses: Normal pulses.     Heart sounds: Normal heart sounds. No murmur. No friction rub. No gallop.   Pulmonary:  Effort: Pulmonary effort is normal. No respiratory distress.     Breath sounds: Normal breath sounds. No wheezing or rales.  Chest:     Chest wall: No tenderness.  Abdominal:     General: Bowel sounds are normal.     Palpations: Abdomen is soft.  Musculoskeletal: Normal range of motion.  Lymphadenopathy:     Cervical: No cervical adenopathy.  Skin:    General: Skin is warm and dry.  Neurological:     Mental Status: She is alert and oriented to person, place, and time.     Cranial Nerves: No cranial nerve deficit.  Psychiatric:        Attention and Perception: Attention and perception normal.        Mood and Affect: Mood is anxious.        Speech: Speech normal.        Behavior: Behavior normal. Behavior is cooperative.        Thought Content: Thought content normal.        Cognition and Memory: Cognition and memory normal.        Judgment: Judgment normal.    Assessment/Plan: 1. Type 2 diabetes mellitus with hyperglycemia, unspecified whether long term insulin use (HCC) - POCT HgB A1C 10.3 today. D/C metformin. Start Janumet XR 50/'500mg'$  daily. Continue to follow diabetic diet information provided at visit when diabetes was diagnosed. Encouraged her to find out about glucose meter ASAP so I can get one ordered for her or give her one from the office. She plans to call the office with that information.  - SitaGLIPtin-MetFORMIN HCl (JANUMET  XR) 50-500 MG TB24; Take 1 tablet by mouth daily.  Dispense: 30 tablet; Refill: 2  2. Essential hypertension Stable. Continue bp medication as prescribed   3. Generalized anxiety disorder with panic attacks May continue to take alprazolam 0.'25mg'$  at bedtime as needed   4. Mixed hyperlipidemia Continue atorvastatin as prescribed   General Counseling: akili cuda understanding of the findings of todays visit and agrees with plan of treatment. I have discussed any further diagnostic evaluation that may be needed or ordered today. We also reviewed her medications today. she has been encouraged to call the office with any questions or concerns that should arise related to todays visit.  Diabetes Counseling:  1. Addition of ACE inh/ ARB'S for nephroprotection. Microalbumin is updated  2. Diabetic foot care, prevention of complications. Podiatry consult 3. Exercise and lose weight.  4. Diabetic eye examination, Diabetic eye exam is updated  5. Monitor blood sugar closlely. nutrition counseling.  6. Sign and symptoms of hypoglycemia including shaking sweating,confusion and headaches.  This patient was seen by Leretha Pol FNP Collaboration with Dr Lavera Guise as a part of collaborative care agreement   Orders Placed This Encounter  Procedures  . POCT HgB A1C    Meds ordered this encounter  Medications  . DISCONTD: SitaGLIPtin-MetFORMIN HCl (JANUMET XR) 50-500 MG TB24    Sig: Take 1 tablet by mouth daily.    Dispense:  30 tablet    Refill:  2    D/c metformin - samples provided    Order Specific Question:   Supervising Provider    Answer:   Lavera Guise [[id number]]  . SitaGLIPtin-MetFORMIN HCl (JANUMET XR) 50-500 MG TB24    Sig: Take 1 tablet by mouth daily.    Dispense:  30 tablet    Refill:  2    D/c metformin    Order Specific Question:   Supervising  Provider    Answer:   Lavera Guise [[id number]]    Time spent: 29 Minutes      Dr Lavera Guise Internal medicine

## 2019-01-02 ENCOUNTER — Other Ambulatory Visit: Payer: Self-pay | Admitting: Nurse Practitioner

## 2019-01-02 DIAGNOSIS — E782 Mixed hyperlipidemia: Secondary | ICD-10-CM | POA: Diagnosis not present

## 2019-01-03 LAB — COMPREHENSIVE METABOLIC PANEL
ALT: 45 IU/L — ABNORMAL HIGH (ref 0–32)
AST: 36 IU/L (ref 0–40)
Albumin/Globulin Ratio: 1.8 (ref 1.2–2.2)
Albumin: 4.6 g/dL (ref 3.8–4.8)
Alkaline Phosphatase: 101 IU/L (ref 39–117)
BUN/Creatinine Ratio: 12 (ref 9–23)
BUN: 10 mg/dL (ref 6–24)
Bilirubin Total: 0.8 mg/dL (ref 0.0–1.2)
CO2: 20 mmol/L (ref 20–29)
Calcium: 10 mg/dL (ref 8.7–10.2)
Chloride: 97 mmol/L (ref 96–106)
Creatinine, Ser: 0.86 mg/dL (ref 0.57–1.00)
GFR calc Af Amer: 91 mL/min/{1.73_m2} (ref >59)
GFR calc non Af Amer: 79 mL/min/{1.73_m2} (ref >59)
Globulin, Total: 2.5 g/dL (ref 1.5–4.5)
Glucose: 223 mg/dL — ABNORMAL HIGH (ref 65–99)
Potassium: 4.6 mmol/L (ref 3.5–5.2)
Sodium: 137 mmol/L (ref 134–144)
Total Protein: 7.1 g/dL (ref 6.0–8.5)

## 2019-01-03 LAB — LIPID PANEL W/O CHOL/HDL RATIO
Cholesterol, Total: 121 mg/dL (ref 100–199)
HDL: 35 mg/dL — ABNORMAL LOW (ref >39)
LDL Calculated: 46 mg/dL (ref 0–99)
Triglycerides: 200 mg/dL — ABNORMAL HIGH (ref 0–149)
VLDL Cholesterol Cal: 40 mg/dL (ref 5–40)

## 2019-01-12 DIAGNOSIS — E1165 Type 2 diabetes mellitus with hyperglycemia: Secondary | ICD-10-CM | POA: Insufficient documentation

## 2019-01-29 ENCOUNTER — Other Ambulatory Visit: Payer: Self-pay

## 2019-01-29 ENCOUNTER — Ambulatory Visit: Payer: BC Managed Care – PPO | Admitting: Nurse Practitioner

## 2019-01-29 ENCOUNTER — Encounter: Payer: Self-pay | Admitting: Nurse Practitioner

## 2019-01-29 VITALS — Ht 62.0 in | Wt 247.0 lb

## 2019-01-29 DIAGNOSIS — F411 Generalized anxiety disorder: Secondary | ICD-10-CM | POA: Diagnosis not present

## 2019-01-29 DIAGNOSIS — E782 Mixed hyperlipidemia: Secondary | ICD-10-CM | POA: Diagnosis not present

## 2019-01-29 DIAGNOSIS — F410 Panic disorder [episodic paroxysmal anxiety] without agoraphobia: Secondary | ICD-10-CM

## 2019-01-29 DIAGNOSIS — E1165 Type 2 diabetes mellitus with hyperglycemia: Secondary | ICD-10-CM

## 2019-01-29 MED ORDER — CONTOUR NEXT TEST VI STRP: ORAL_STRIP | 11 refills | Status: DC

## 2019-01-29 MED ORDER — GLIMEPIRIDE 1 MG PO TABS: 1.0000 mg | ORAL_TABLET | Freq: Every day | ORAL | 3 refills | Status: DC

## 2019-01-29 NOTE — Progress Notes (Signed)
[facility] [place], [place] [address]  Internal MEDICINE  Telephone Visit  Patient Name: Rebecca Henry  [id number]  [id number]  Date of Service: 02/01/2019  I connected with the patient at 5:12am by telephone and verified the patients identity using two identifiers.   I discussed the limitations, risks, security and privacy concerns of performing an evaluation and management service by telephone and the availability of in person appointments. I also discussed with the patient that there may be a patient responsible charge related to the service.  The patient expressed understanding and agrees to proceed.    Chief Complaint  Patient presents with  . Telephone Screen  . Telephone Assessment  . Anxiety  . Hyperlipidemia  . Diabetes    pt like to talk to about janumet   . Quality Metric Gaps    colonscopy,foot exam and urine microalbumin need AWV    The patient has been contacted via telephone for follow up visit due to concerns for spread of novel coronavirus. Today, she states that her blood sugars are still running a bit high. She did start on januvia and sugars did quite well. However, even with copay assistance, this medication is far too expensive. She is taking her metformin twice daily. Is no missing any doses. She is checking her blood sugar every morning as well as checking it about two hours after each meal. She states that post-prandial sugars continue to be most elevated, mostly above 200. She is carefully watching her calorie intake as well as the carbohydrate and sugar.       Current Medication: Outpatient Encounter Medications as of 01/29/2019  Medication Sig  . ALPRAZolam (XANAX) 0.25 MG tablet Take 1 tablet (0.25 mg total) by mouth at bedtime as needed for anxiety (weeknights after 6pm).  Marland Kitchen atenolol (TENORMIN) 25 MG tablet Take one tablet by mouth daily  . atorvastatin (LIPITOR) 20 MG tablet Take 1 tablet (20 mg total) by mouth daily.  .  blood glucose meter kit and supplies KIT Dispense based on patient and insurance preference. Use up to four times daily as directed. (FOR ICD-9 250.00, 250.01).  . cetirizine (ZYRTEC) 5 MG tablet Take 5 mg by mouth daily.  . chlorpheniramine (CHLOR-TRIMETON) 4 MG tablet Take 4 mg by mouth daily.  . cyclobenzaprine (FLEXERIL) 5 MG tablet Take 1 tablet (5 mg total) by mouth 2 (two) times daily as needed.  Marland Kitchen ibuprofen (ADVIL,MOTRIN) 600 MG tablet Take 1 tablet (600 mg total) by mouth every 6 (six) hours as needed for moderate pain.  Marland Kitchen loratadine (CLARITIN) 10 MG tablet Take 10 mg by mouth daily.  Marland Kitchen glimepiride (AMARYL) 1 MG tablet Take 1 tablet (1 mg total) by mouth daily with breakfast.  . glucose blood (CONTOUR NEXT TEST) test strip Blood sugar testing testing three times daily  DX E11.65  . [DISCONTINUED] SitaGLIPtin-MetFORMIN HCl (JANUMET XR) 50-500 MG TB24 Take 1 tablet by mouth daily. (Patient not taking: Reported on 01/29/2019)   No facility-administered encounter medications on file as of 01/29/2019.     Surgical History: Past Surgical History:  Procedure Laterality Date  . CESAREAN SECTION      Medical History: Past Medical History:  Diagnosis Date  . Allergic rhinitis   . Anxiety   . Hyperlipidemia   . Sleep apnea     Family History: Family History  Problem Relation Age of Onset  . Hypertension Mother   . Diabetes Maternal Grandmother   . Hypothyroidism Maternal Grandmother  Social History   Socioeconomic History  . Marital status: Single    Spouse name: Not on file  . Number of children: Not on file  . Years of education: Not on file  . Highest education level: Not on file  Occupational History  . Not on file  Social Needs  . Financial resource strain: Not on file  . Food insecurity    Worry: Not on file    Inability: Not on file  . Transportation needs    Medical: Not on file    Non-medical: Not on file  Tobacco Use  . Smoking status: Never Smoker  .  Smokeless tobacco: Never Used  Substance and Sexual Activity  . Alcohol use: Yes    Comment: very rarely  . Drug use: No  . Sexual activity: Not on file  Lifestyle  . Physical activity    Days per week: Not on file    Minutes per session: Not on file  . Stress: Not on file  Relationships  . Social Herbalist on phone: Not on file    Gets together: Not on file    Attends religious service: Not on file    Active member of club or organization: Not on file    Attends meetings of clubs or organizations: Not on file    Relationship status: Not on file  . Intimate partner violence    Fear of current or ex partner: Not on file    Emotionally abused: Not on file    Physically abused: Not on file    Forced sexual activity: Not on file  Other Topics Concern  . Not on file  Social History Narrative  . Not on file      Review of Systems  Constitutional: Positive for fatigue and unexpected weight change. Negative for activity change and chills.       Increased weight.  HENT: Negative for congestion, ear pain, postnasal drip, rhinorrhea, sneezing, sore throat and voice change.   Respiratory: Negative for cough, chest tightness and shortness of breath.   Cardiovascular: Negative for chest pain and palpitations.  Gastrointestinal: Negative for abdominal pain, constipation, diarrhea, nausea and vomiting.  Endocrine: Negative for cold intolerance, heat intolerance, polydipsia and polyuria.       Blood sugars remain elevated.  Musculoskeletal: Negative for arthralgias, back pain, joint swelling, myalgias and neck pain.  Skin: Negative for rash.  Allergic/Immunologic: Positive for environmental allergies.  Neurological: Positive for headaches. Negative for dizziness, tremors and numbness.  Hematological: Negative for adenopathy. Does not bruise/bleed easily.  Psychiatric/Behavioral: Positive for dysphoric mood. Negative for behavioral problems (Depression), sleep disturbance and  suicidal ideas. The patient is nervous/anxious.     Today's Vitals   01/29/19 1557  Weight: 247 lb (112 kg)  Height: 5' 2" (1.575 m)   Body mass index is 45.18 kg/m.  Observation/Objective:   The patient is alert and oriented. She is pleasant and answers all questions appropriately. Breathing is non-labored. She is in no acute distress at this time. The patient sounds upset and tearful at times during the visit.    Assessment/Plan: 1. Type 2 diabetes mellitus with hyperglycemia, unspecified whether long term insulin use (HCC) Due to elevated HgbA1c, will add amaryl 31m daily. She should continue metformin 5014mtwice daily. Monitor blood sugars three times daily. Will check hgbA1c at next, in-office visit.  - glucose blood (CONTOUR NEXT TEST) test strip; Blood sugar testing testing three times daily  DX E11.65  Dispense:  100 each; Refill: 11 - glimepiride (AMARYL) 1 MG tablet; Take 1 tablet (1 mg total) by mouth daily with breakfast.  Dispense: 30 tablet; Refill: 3  2. Mixed hyperlipidemia Continue atorvastatin as prescribed   3. Generalized anxiety disorder with panic attacks She may continue alprazolam 0.94m at bedtime as needed for anxiety/insomnia.  General Counseling: Klavayah vitaunderstanding of the findings of today's phone visit and agrees with plan of treatment. I have discussed any further diagnostic evaluation that may be needed or ordered today. We also reviewed her medications today. she has been encouraged to call the office with any questions or concerns that should arise related to todays visit.  Diabetes Counseling:  1. Addition of ACE inh/ ARB'S for nephroprotection. Microalbumin is updated  2. Diabetic foot care, prevention of complications. Podiatry consult 3. Exercise and lose weight.  4. Diabetic eye examination, Diabetic eye exam is updated  5. Monitor blood sugar closlely. nutrition counseling.  6. Sign and symptoms of hypoglycemia including shaking  sweating,confusion and headaches.  This patient was seen by HTorreonwith Dr FLavera Guiseas a part of collaborative care agreement  Meds ordered this encounter  Medications  . glucose blood (CONTOUR NEXT TEST) test strip    Sig: Blood sugar testing testing three times daily  DX E11.65    Dispense:  100 each    Refill:  11    Order Specific Question:   Supervising Provider    Answer:   KLavera Guise[[place] . glimepiride (AMARYL) 1 MG tablet    Sig: Take 1 tablet (1 mg total) by mouth daily with breakfast.    Dispense:  30 tablet    Refill:  3    Order Specific Question:   Supervising Provider    Answer:   KLavera Guise[[[id number]]   Time spent: 259Minutes    Dr FLavera GuiseInternal medicine

## 2019-02-13 ENCOUNTER — Other Ambulatory Visit: Payer: Self-pay

## 2019-02-13 ENCOUNTER — Ambulatory Visit (INDEPENDENT_AMBULATORY_CARE_PROVIDER_SITE_OTHER): Payer: BC Managed Care – PPO | Admitting: Nurse Practitioner

## 2019-02-13 ENCOUNTER — Encounter: Payer: Self-pay | Admitting: Nurse Practitioner

## 2019-02-13 VITALS — BP 137/70 | HR 77 | Temp 97.8°F | Resp 16 | Ht 62.25 in | Wt 247.4 lb

## 2019-02-13 DIAGNOSIS — R300 Dysuria: Secondary | ICD-10-CM

## 2019-02-13 DIAGNOSIS — N390 Urinary tract infection, site not specified: Secondary | ICD-10-CM | POA: Diagnosis not present

## 2019-02-13 LAB — POCT URINALYSIS DIPSTICK
Bilirubin, UA: NEGATIVE
Blood, UA: NEGATIVE
Glucose, UA: NEGATIVE
Ketones, UA: NEGATIVE
Nitrite, UA: NEGATIVE
Protein, UA: NEGATIVE
Spec Grav, UA: 1.015 (ref 1.010–1.025)
Urobilinogen, UA: 0.2 E.U./dL
pH, UA: 5 (ref 5.0–8.0)

## 2019-02-13 MED ORDER — SULFAMETHOXAZOLE-TRIMETHOPRIM 800-160 MG PO TABS: 1.0000 | ORAL_TABLET | Freq: Two times a day (BID) | ORAL | 0 refills | Status: DC

## 2019-02-13 NOTE — Progress Notes (Signed)
[facility] [place], [place] [address]  Internal MEDICINE  Office Visit Note  Patient Name: Rebecca Henry  [id number]  [id number]  Date of Service: 02/13/2019   Pt is here for a sick visit.  Chief Complaint  Patient presents with  . Urinary Tract Infection    pt noticed the buring yesterday and today it went away,  its only lower back pain. pt isnt feeling her normal self,     The patient is here for sick visit. She is burning with urination and bladder spasms for two days. A little better this morning, however bladder spasms still present. She does have some pelvic discomfort and back pain. She denies fever, nausea, or vomiting.        Current Medication:  Outpatient Encounter Medications as of 02/13/2019  Medication Sig  . ALPRAZolam (XANAX) 0.25 MG tablet Take 1 tablet (0.25 mg total) by mouth at bedtime as needed for anxiety (weeknights after 6pm).  Marland Kitchen atenolol (TENORMIN) 25 MG tablet Take one tablet by mouth daily  . atorvastatin (LIPITOR) 20 MG tablet Take 1 tablet (20 mg total) by mouth daily.  . blood glucose meter kit and supplies KIT Dispense based on patient and insurance preference. Use up to four times daily as directed. (FOR ICD-9 250.00, 250.01).  . cetirizine (ZYRTEC) 5 MG tablet Take 5 mg by mouth daily.  . chlorpheniramine (CHLOR-TRIMETON) 4 MG tablet Take 4 mg by mouth daily.  . cyclobenzaprine (FLEXERIL) 5 MG tablet Take 1 tablet (5 mg total) by mouth 2 (two) times daily as needed.  Marland Kitchen glimepiride (AMARYL) 1 MG tablet Take 1 tablet (1 mg total) by mouth daily with breakfast.  . glucose blood (CONTOUR NEXT TEST) test strip Blood sugar testing testing three times daily  DX E11.65  . ibuprofen (ADVIL,MOTRIN) 600 MG tablet Take 1 tablet (600 mg total) by mouth every 6 (six) hours as needed for moderate pain.  Marland Kitchen loratadine (CLARITIN) 10 MG tablet Take 10 mg by mouth daily.  Marland Kitchen sulfamethoxazole-trimethoprim (BACTRIM DS) 800-160 MG tablet  Take 1 tablet by mouth 2 (two) times daily.   No facility-administered encounter medications on file as of 02/13/2019.       Medical History: Past Medical History:  Diagnosis Date  . Allergic rhinitis   . Anxiety   . Hyperlipidemia   . Sleep apnea      Vital Signs: BP 137/70 (BP Location: Left Arm, Patient Position: Sitting, Cuff Size: Large)   Pulse 77   Temp 97.8 F (36.6 C)   Resp 16   Ht 5' 2.25" (1.581 m)   Wt 247 lb 6.4 oz (112.2 kg)   SpO2 96%   BMI 44.89 kg/m    Review of Systems  Constitutional: Negative for chills, fatigue and unexpected weight change.  HENT: Negative for congestion, postnasal drip, rhinorrhea, sneezing and sore throat.   Respiratory: Negative for cough, chest tightness and shortness of breath.   Cardiovascular: Negative for chest pain and palpitations.  Gastrointestinal: Negative for abdominal pain, constipation, diarrhea, nausea and vomiting.  Genitourinary: Positive for dysuria, flank pain, frequency and urgency.  Musculoskeletal: Positive for back pain. Negative for arthralgias, joint swelling and neck pain.  Skin: Negative for rash.  Hematological: Negative for adenopathy. Does not bruise/bleed easily.  Psychiatric/Behavioral: Negative for behavioral problems (Depression), sleep disturbance and suicidal ideas. The patient is not nervous/anxious.     Physical Exam Vitals signs and nursing note reviewed.  Constitutional:      General:  She is not in acute distress.    Appearance: Normal appearance. She is well-developed. She is not diaphoretic.  HENT:     Head: Normocephalic and atraumatic.     Mouth/Throat:     Pharynx: No oropharyngeal exudate.  Eyes:     Pupils: Pupils are equal, round, and reactive to light.  Neck:     Musculoskeletal: Normal range of motion and neck supple.     Thyroid: No thyromegaly.     Vascular: No JVD.     Trachea: No tracheal deviation.  Cardiovascular:     Rate and Rhythm: Normal rate and regular  rhythm.     Heart sounds: Normal heart sounds. No murmur. No friction rub. No gallop.   Pulmonary:     Effort: Pulmonary effort is normal. No respiratory distress.     Breath sounds: Normal breath sounds. No wheezing or rales.  Chest:     Chest wall: No tenderness.  Abdominal:     Palpations: Abdomen is soft.  Genitourinary:    Comments: U/a positive for trace WBC Musculoskeletal: Normal range of motion.  Lymphadenopathy:     Cervical: No cervical adenopathy.  Skin:    General: Skin is warm and dry.  Neurological:     Mental Status: She is alert and oriented to person, place, and time.     Cranial Nerves: No cranial nerve deficit.  Psychiatric:        Behavior: Behavior normal.        Thought Content: Thought content normal.        Judgment: Judgment normal.    Assessment/Plan: 1. Urinary tract infection without hematuria, site unspecified Start bactrim DS bid for 7 days. Will send urine for culture and sensitivity and adjust antibiotics as indicated.  - sulfamethoxazole-trimethoprim (BACTRIM DS) 800-160 MG tablet; Take 1 tablet by mouth 2 (two) times daily.  Dispense: 14 tablet; Refill: 0  2. Dysuria Try OTC AZO to help with bladder pain and spasms.  - POCT Urinalysis Dipstick - CULTURE, URINE COMPREHENSIVE  General Counseling: Phaedra verbalizes understanding of the findings of todays visit and agrees with plan of treatment. I have discussed any further diagnostic evaluation that may be needed or ordered today. We also reviewed her medications today. she has been encouraged to call the office with any questions or concerns that should arise related to todays visit.    Counseling:  This patient was seen by Leretha Pol FNP Collaboration with Dr Lavera Guise as a part of collaborative care agreement  Orders Placed This Encounter  Procedures  . CULTURE, URINE COMPREHENSIVE  . POCT Urinalysis Dipstick    Meds ordered this encounter  Medications  .  sulfamethoxazole-trimethoprim (BACTRIM DS) 800-160 MG tablet    Sig: Take 1 tablet by mouth 2 (two) times daily.    Dispense:  14 tablet    Refill:  0    Order Specific Question:   Supervising Provider    Answer:   Lavera Guise [[id number]]    Time spent: 25 Minutes

## 2019-02-16 LAB — CULTURE, URINE COMPREHENSIVE

## 2019-03-10 ENCOUNTER — Other Ambulatory Visit: Payer: Self-pay

## 2019-03-10 MED ORDER — METFORMIN HCL 500 MG PO TABS: 500.0000 mg | ORAL_TABLET | Freq: Two times a day (BID) | ORAL | 5 refills | Status: DC

## 2019-04-03 ENCOUNTER — Encounter: Payer: Self-pay | Admitting: Nurse Practitioner

## 2019-04-03 ENCOUNTER — Ambulatory Visit (INDEPENDENT_AMBULATORY_CARE_PROVIDER_SITE_OTHER): Payer: BC Managed Care – PPO | Admitting: Nurse Practitioner

## 2019-04-03 VITALS — BP 127/75 | HR 79 | Resp 16 | Ht 62.0 in | Wt 249.0 lb

## 2019-04-03 DIAGNOSIS — E1165 Type 2 diabetes mellitus with hyperglycemia: Secondary | ICD-10-CM | POA: Diagnosis not present

## 2019-04-03 DIAGNOSIS — Z0001 Encounter for general adult medical examination with abnormal findings: Secondary | ICD-10-CM | POA: Diagnosis not present

## 2019-04-03 DIAGNOSIS — L209 Atopic dermatitis, unspecified: Secondary | ICD-10-CM

## 2019-04-03 DIAGNOSIS — F410 Panic disorder [episodic paroxysmal anxiety] without agoraphobia: Secondary | ICD-10-CM

## 2019-04-03 DIAGNOSIS — I10 Essential (primary) hypertension: Secondary | ICD-10-CM | POA: Diagnosis not present

## 2019-04-03 DIAGNOSIS — Z1239 Encounter for other screening for malignant neoplasm of breast: Secondary | ICD-10-CM

## 2019-04-03 DIAGNOSIS — F411 Generalized anxiety disorder: Secondary | ICD-10-CM

## 2019-04-03 DIAGNOSIS — E782 Mixed hyperlipidemia: Secondary | ICD-10-CM

## 2019-04-03 DIAGNOSIS — Z1211 Encounter for screening for malignant neoplasm of colon: Secondary | ICD-10-CM

## 2019-04-03 LAB — POCT GLYCOSYLATED HEMOGLOBIN (HGB A1C): Hemoglobin A1C: 6.5 % — AB (ref 4.0–5.6)

## 2019-04-03 MED ORDER — TRIAMCINOLONE ACETONIDE 0.025 % EX CREA: 1.0000 "application " | TOPICAL_CREAM | Freq: Two times a day (BID) | CUTANEOUS | 2 refills | Status: DC

## 2019-04-03 MED ORDER — ATORVASTATIN CALCIUM 20 MG PO TABS: 20.0000 mg | ORAL_TABLET | Freq: Every day | ORAL | 3 refills | Status: DC

## 2019-04-03 MED ORDER — ALPRAZOLAM 0.25 MG PO TABS: 0.2500 mg | ORAL_TABLET | Freq: Every evening | ORAL | 1 refills | Status: DC | PRN

## 2019-04-03 NOTE — Progress Notes (Signed)
[facility] [place], [place] [address]  Internal MEDICINE  Office Visit Note  Patient Name: Rebecca Henry  [id number]  [id number]  Date of Service: 04/05/2019   Pt is here for routine health maintenance examination  Chief Complaint  Patient presents with  . Annual Exam  . Diabetes  . Hypertension  . Hyperlipidemia  . Quality Metric Gaps    colonoscopy and diabetic foot exam and mammogram     The patient is here for health maintenance exam. She has been doing much better with diabetes. Is taking metformin '500mg'$  twice daily and amaryl daily. She is monitoring blood sugars every morning. They are generally running in the low 100s. She is feeling well and tolerating her medications well. Blood pressures are well controlled. She has no new concerns or complaints.     Current Medication: Outpatient Encounter Medications as of 04/03/2019  Medication Sig  . ALPRAZolam (XANAX) 0.25 MG tablet Take 1 tablet (0.25 mg total) by mouth at bedtime as needed for anxiety (weeknights after 6pm).  Marland Kitchen atenolol (TENORMIN) 25 MG tablet Take one tablet by mouth daily  . atorvastatin (LIPITOR) 20 MG tablet Take 1 tablet (20 mg total) by mouth daily.  . blood glucose meter kit and supplies KIT Dispense based on patient and insurance preference. Use up to four times daily as directed. (FOR ICD-9 250.00, 250.01).  . cetirizine (ZYRTEC) 5 MG tablet Take 5 mg by mouth daily.  . chlorpheniramine (CHLOR-TRIMETON) 4 MG tablet Take 4 mg by mouth daily.  . cyclobenzaprine (FLEXERIL) 5 MG tablet Take 1 tablet (5 mg total) by mouth 2 (two) times daily as needed.  Marland Kitchen glimepiride (AMARYL) 1 MG tablet Take 1 tablet (1 mg total) by mouth daily with breakfast.  . glucose blood (CONTOUR NEXT TEST) test strip Blood sugar testing testing three times daily  DX E11.65  . ibuprofen (ADVIL,MOTRIN) 600 MG tablet Take 1 tablet (600 mg total) by mouth every 6 (six) hours as needed for moderate pain.  Marland Kitchen  loratadine (CLARITIN) 10 MG tablet Take 10 mg by mouth daily as needed.   . metFORMIN (GLUCOPHAGE) 500 MG tablet Take 1 tablet (500 mg total) by mouth 2 (two) times daily with a meal.  . [DISCONTINUED] ALPRAZolam (XANAX) 0.25 MG tablet Take 1 tablet (0.25 mg total) by mouth at bedtime as needed for anxiety (weeknights after 6pm).  . [DISCONTINUED] atorvastatin (LIPITOR) 20 MG tablet Take 1 tablet (20 mg total) by mouth daily.  Marland Kitchen triamcinolone (KENALOG) 0.025 % cream Apply 1 application topically 2 (two) times daily.  . [DISCONTINUED] sulfamethoxazole-trimethoprim (BACTRIM DS) 800-160 MG tablet Take 1 tablet by mouth 2 (two) times daily. (Patient not taking: Reported on 04/03/2019)   No facility-administered encounter medications on file as of 04/03/2019.     Surgical History: Past Surgical History:  Procedure Laterality Date  . CESAREAN SECTION      Medical History: Past Medical History:  Diagnosis Date  . Allergic rhinitis   . Anxiety   . Hyperlipidemia   . Sleep apnea     Family History: Family History  Problem Relation Age of Onset  . Hypertension Mother   . Diabetes Maternal Grandmother   . Hypothyroidism Maternal Grandmother       Review of Systems  Constitutional: Negative for activity change, chills, fatigue and unexpected weight change.  HENT: Negative for congestion, postnasal drip, rhinorrhea, sneezing and sore throat.   Respiratory: Negative for cough, chest tightness, shortness of breath and wheezing.  Cardiovascular: Negative for chest pain and palpitations.  Gastrointestinal: Negative for abdominal pain, constipation, diarrhea, nausea and vomiting.  Endocrine: Negative for cold intolerance, heat intolerance, polydipsia and polyuria.       Improved blood sugar control.   Musculoskeletal: Positive for back pain and myalgias. Negative for arthralgias, joint swelling and neck pain.  Skin: Negative for rash.  Allergic/Immunologic: Negative for environmental  allergies.  Neurological: Negative for dizziness, tremors, numbness and headaches.  Hematological: Negative for adenopathy. Does not bruise/bleed easily.  Psychiatric/Behavioral: Negative for behavioral problems (Depression), sleep disturbance and suicidal ideas. The patient is nervous/anxious.    Today's Vitals   04/03/19 1517  BP: 127/75  Pulse: 79  Resp: 16  SpO2: 97%  Weight: 249 lb (112.9 kg)  Height: '5\' 2"'$  (1.575 m)   Body mass index is 45.54 kg/m.  Physical Exam Vitals signs and nursing note reviewed.  Constitutional:      General: She is not in acute distress.    Appearance: Normal appearance. She is well-developed. She is not diaphoretic.  HENT:     Head: Normocephalic and atraumatic.     Mouth/Throat:     Pharynx: No oropharyngeal exudate.  Eyes:     Pupils: Pupils are equal, round, and reactive to light.  Neck:     Musculoskeletal: Normal range of motion and neck supple.     Thyroid: No thyromegaly.     Vascular: No carotid bruit or JVD.     Trachea: No tracheal deviation.  Cardiovascular:     Rate and Rhythm: Normal rate and regular rhythm.     Pulses: Normal pulses.          Dorsalis pedis pulses are 2+ on the right side and 2+ on the left side.       Posterior tibial pulses are 2+ on the right side and 2+ on the left side.     Heart sounds: Normal heart sounds. No murmur. No friction rub. No gallop.   Pulmonary:     Effort: Pulmonary effort is normal. No respiratory distress.     Breath sounds: Normal breath sounds. No wheezing or rales.  Chest:     Chest wall: No tenderness.     Breasts:        Right: Normal. No swelling, bleeding, inverted nipple, mass, nipple discharge, skin change or tenderness.        Left: Normal. No swelling, bleeding, inverted nipple, mass, nipple discharge, skin change or tenderness.  Abdominal:     General: Bowel sounds are normal.     Palpations: Abdomen is soft.     Tenderness: There is no abdominal tenderness.   Musculoskeletal: Normal range of motion.     Right foot: Normal range of motion. No deformity or bunion.     Left foot: Normal range of motion. No deformity or bunion.  Feet:     Right foot:     Protective Sensation: 10 sites tested. 10 sites sensed.     Skin integrity: Skin integrity normal.     Toenail Condition: Right toenails are ingrown.     Left foot:     Protective Sensation: 10 sites tested. 10 sites sensed.     Skin integrity: Skin integrity normal.     Toenail Condition: Left toenails are ingrown.  Lymphadenopathy:     Cervical: No cervical adenopathy.  Skin:    General: Skin is warm and dry.  Neurological:     Mental Status: She is alert and oriented to person, place,  and time.     Cranial Nerves: No cranial nerve deficit.  Psychiatric:        Behavior: Behavior normal.        Thought Content: Thought content normal.        Judgment: Judgment normal.    LABS: Recent Results (from the past 2160 hour(s))  CULTURE, URINE COMPREHENSIVE     Status: None   Collection Time: 02/13/19  8:30 AM   Specimen: Urine   URINE  Result Value Ref Range   Urine Culture, Comprehensive Final report    Organism ID, Bacteria Comment     Comment: No growth in 36 - 48 hours.  POCT Urinalysis Dipstick     Status: Abnormal   Collection Time: 02/13/19  8:56 AM  Result Value Ref Range   Color, UA     Clarity, UA     Glucose, UA Negative Negative   Bilirubin, UA neg    Ketones, UA neg    Spec Grav, UA 1.015 1.010 - 1.025   Blood, UA neg    pH, UA 5.0 5.0 - 8.0   Protein, UA Negative Negative   Urobilinogen, UA 0.2 0.2 or 1.0 E.U./dL   Nitrite, UA neg    Leukocytes, UA Trace (A) Negative   Appearance     Odor    POCT HgB A1C     Status: Abnormal   Collection Time: 04/03/19  3:33 PM  Result Value Ref Range   Hemoglobin A1C 6.5 (A) 4.0 - 5.6 %   HbA1c POC (<> result, manual entry)     HbA1c, POC (prediabetic range)     HbA1c, POC (controlled diabetic range)      Assessment/Plan: 1. Encounter for general adult medical examination with abnormal findings Annual health maintenance exam today.   2. Type 2 diabetes mellitus with hyperglycemia, unspecified whether long term insulin use (HCC) - POCT HgB A1C 6.5 today. Much improved from 10.3 at her last visit. Continue diabetic medications as prescribed. monitor blood sugars closely.   3. Essential hypertension Stable. Continue bp medication as prescribed.   4. Mixed hyperlipidemia - atorvastatin (LIPITOR) 20 MG tablet; Take 1 tablet (20 mg total) by mouth daily.  Dispense: 30 tablet; Refill: 3  5. Atopic dermatitis, unspecified type - triamcinolone (KENALOG) 0.025 % cream; Apply 1 application topically 2 (two) times daily.  Dispense: 80 g; Refill: 2  6. Generalized anxiety disorder with panic attacks Refills on current prescription are expired. May continue alprazolam 0.'25mg'$  at bedtime as needed for acute anxiety. New prescription sent to her pharmacy.  - ALPRAZolam (XANAX) 0.25 MG tablet; Take 1 tablet (0.25 mg total) by mouth at bedtime as needed for anxiety (weeknights after 6pm).  Dispense: 30 tablet; Refill: 1  7. Screening for breast cancer - MM DIGITAL SCREENING BILATERAL; Future  8. Screening for colon cancer Refer to GI for screening colonoscopy.  - Ambulatory referral to Gastroenterology  General Counseling: cici rodriges understanding of the findings of todays visit and agrees with plan of treatment. I have discussed any further diagnostic evaluation that may be needed or ordered today. We also reviewed her medications today. she has been encouraged to call the office with any questions or concerns that should arise related to todays visit.    Counseling:  Diabetes Counseling:  1. Addition of ACE inh/ ARB'S for nephroprotection. Microalbumin is updated  2. Diabetic foot care, prevention of complications. Podiatry consult 3. Exercise and lose weight.  4. Diabetic eye  examination, Diabetic eye exam  is updated  5. Monitor blood sugar closlely. nutrition counseling.  6. Sign and symptoms of hypoglycemia including shaking sweating,confusion and headaches.  This patient was seen by Leretha Pol FNP Collaboration with Dr Lavera Guise as a part of collaborative care agreement  Orders Placed This Encounter  Procedures  . MM DIGITAL SCREENING BILATERAL  . Ambulatory referral to Gastroenterology  . POCT HgB A1C    Meds ordered this encounter  Medications  . triamcinolone (KENALOG) 0.025 % cream    Sig: Apply 1 application topically 2 (two) times daily.    Dispense:  80 g    Refill:  2    Order Specific Question:   Supervising Provider    Answer:   Lavera Guise [[id number]]  . atorvastatin (LIPITOR) 20 MG tablet    Sig: Take 1 tablet (20 mg total) by mouth daily.    Dispense:  30 tablet    Refill:  3    Order Specific Question:   Supervising Provider    Answer:   Lavera Guise [[id number]]  . ALPRAZolam (XANAX) 0.25 MG tablet    Sig: Take 1 tablet (0.25 mg total) by mouth at bedtime as needed for anxiety (weeknights after 6pm).    Dispense:  30 tablet    Refill:  1    Please hold for when patient needs fill. thanks    Order Specific Question:   Supervising Provider    Answer:   Lavera Guise [[id number]]    Time spent: [place], MD  Internal Medicine

## 2019-04-14 ENCOUNTER — Encounter: Payer: Self-pay | Admitting: *Deleted

## 2019-05-01 ENCOUNTER — Other Ambulatory Visit: Payer: Self-pay

## 2019-05-01 ENCOUNTER — Ambulatory Visit: Payer: BC Managed Care – PPO | Admitting: Nurse Practitioner

## 2019-05-01 ENCOUNTER — Encounter: Payer: Self-pay | Admitting: Nurse Practitioner

## 2019-05-01 VITALS — BP 138/63 | HR 80 | Resp 16 | Ht 62.0 in | Wt 251.0 lb

## 2019-05-01 DIAGNOSIS — R300 Dysuria: Secondary | ICD-10-CM

## 2019-05-01 DIAGNOSIS — N390 Urinary tract infection, site not specified: Secondary | ICD-10-CM

## 2019-05-01 DIAGNOSIS — E1165 Type 2 diabetes mellitus with hyperglycemia: Secondary | ICD-10-CM

## 2019-05-01 LAB — POCT URINALYSIS DIPSTICK
Bilirubin, UA: NEGATIVE
Blood, UA: NEGATIVE
Glucose, UA: NEGATIVE
Ketones, UA: NEGATIVE
Leukocytes, UA: NEGATIVE
Nitrite, UA: NEGATIVE
Protein, UA: NEGATIVE
Spec Grav, UA: 1.01 (ref 1.010–1.025)
Urobilinogen, UA: 0.2 E.U./dL
pH, UA: 5 (ref 5.0–8.0)

## 2019-05-01 NOTE — Progress Notes (Signed)
[facility] [place], [place] [address]  Internal MEDICINE  Office Visit Note  Patient Name: Rebecca Henry  [id number]  [id number]  Date of Service: 05/03/2019   Pt is here for a sick visit.  Chief Complaint  Patient presents with  . Urinary Tract Infection    felt like the beginning of urgency      Ms. Bouler presents with complaints of urinary irritation starting this morning and concern for urinary tract infection. She states that she had a very painful UTI approximately one year ago, and she wants to try to avoid those symptoms again. She denies urinary burning, frequency, urgency, hematuria, or fever. She states that the sensation is similar to what she believes may be urinary spasms or irritated tissue. She reports that her urine is on the darker side, and that she has not been drinking very much water lately. She states that she has mostly been drinking diet sodas and G2 (gatorade). 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 Urinalysis was negative.       Current Medication:  Outpatient Encounter Medications as of 05/01/2019  Medication Sig  . ALPRAZolam (XANAX) 0.25 MG tablet Take 1 tablet (0.25 mg total) by mouth at bedtime as needed for anxiety (weeknights after 6pm).  Marland Kitchen atenolol (TENORMIN) 25 MG tablet Take one tablet by mouth daily  . atorvastatin (LIPITOR) 20 MG tablet Take 1 tablet (20 mg total) by mouth daily.  . blood glucose meter kit and supplies KIT Dispense based on patient and insurance preference. Use up to four times daily as directed. (FOR ICD-9 250.00, 250.01).  . cetirizine (ZYRTEC) 5 MG tablet Take 5 mg by mouth daily.  . chlorpheniramine (CHLOR-TRIMETON) 4 MG tablet Take 4 mg by mouth daily.  . cyclobenzaprine (FLEXERIL) 5 MG tablet Take 1 tablet (5 mg total) by mouth 2 (two) times daily as needed.  Marland Kitchen glimepiride (AMARYL) 1 MG tablet Take 1 tablet (1 mg total) by mouth daily  with breakfast.  . glucose blood (CONTOUR NEXT TEST) test strip Blood sugar testing testing three times daily  DX E11.65  . ibuprofen (ADVIL,MOTRIN) 600 MG tablet Take 1 tablet (600 mg total) by mouth every 6 (six) hours as needed for moderate pain.  Marland Kitchen loratadine (CLARITIN) 10 MG tablet Take 10 mg by mouth daily as needed.   . metFORMIN (GLUCOPHAGE) 500 MG tablet Take 1 tablet (500 mg total) by mouth 2 (two) times daily with a meal.  . triamcinolone (KENALOG) 0.025 % cream Apply 1 application topically 2 (two) times daily.   No facility-administered encounter medications on file as of 05/01/2019.       Medical History: Past Medical History:  Diagnosis Date  . Allergic rhinitis   . Anxiety   . Hyperlipidemia   . Sleep apnea      Vital Signs: BP 138/63   Pulse 80   Resp 16   Ht _0  (1.575 m)   Wt 251 lb (113.9 kg)   SpO2 97%   BMI 45.91 kg/m    Review of Systems  Constitutional: Negative for chills, fatigue and unexpected weight change.  HENT: Negative for congestion, postnasal drip, rhinorrhea, sneezing and sore throat.   Respiratory: Negative for cough, chest tightness and shortness of breath.   Cardiovascular: Negative for chest pain and palpitations.  Gastrointestinal: Negative for abdominal pain, constipation, diarrhea, nausea and vomiting.  Genitourinary: Positive for dysuria, frequency and urgency. Negative  for difficulty urinating and hematuria.  Musculoskeletal: Positive for back pain. Negative for arthralgias, joint swelling and neck pain.  Skin: Negative for rash.  Neurological: Negative for dizziness, tremors, numbness and headaches.  Hematological: Negative for adenopathy. Does not bruise/bleed easily.  Psychiatric/Behavioral: Negative for behavioral problems (Depression), sleep disturbance and suicidal ideas. The patient is nervous/anxious.     Physical Exam Vitals signs and nursing note reviewed.  Constitutional:      Appearance: Normal appearance. She  is obese.  Cardiovascular:     Rate and Rhythm: Normal rate and regular rhythm.     Heart sounds: Normal heart sounds.  Pulmonary:     Effort: Pulmonary effort is normal.     Breath sounds: Normal breath sounds.  Abdominal:     General: Bowel sounds are normal.     Palpations: Abdomen is soft.     Tenderness: There is no abdominal tenderness.  Genitourinary:    Comments: Urine sample is negative for any abnormalities or evidence of infection.  Neurological:     Mental Status: She is alert and oriented to person, place, and time.  Psychiatric:        Mood and Affect: Mood normal.        Behavior: Behavior is cooperative.   Assessment/Plan: 1. Dysuria Urine dip is negative for evidence of infection or other abnormalities.  - POCT Urinalysis Dipstick  2. Urinary tract infection without hematuria, site unspecified Will send urine for culture and sensitivity and treat as indicated.  - CULTURE, URINE COMPREHENSIVE  3. Type 2 diabetes mellitus with hyperglycemia, without long-term current use of insulin ([place]) Continue diabetic medication as prescribed.   General Counseling: ashley bultema understanding of the findings of todays visit and agrees with plan of treatment. I have discussed any further diagnostic evaluation that may be needed or ordered today. We also reviewed her medications today. she has been encouraged to call the office with any questions or concerns that should arise related to todays visit.    Counseling:  This patient was seen by Leretha Pol FNP Collaboration with Dr Lavera Guise as a part of collaborative care agreement  Orders Placed This Encounter  Procedures  . CULTURE, URINE COMPREHENSIVE  . POCT Urinalysis Dipstick      Time spent: 25 Minutes

## 2019-05-04 LAB — CULTURE, URINE COMPREHENSIVE

## 2019-05-06 ENCOUNTER — Telehealth: Payer: Self-pay

## 2019-05-06 ENCOUNTER — Other Ambulatory Visit: Payer: Self-pay

## 2019-05-06 DIAGNOSIS — Z1211 Encounter for screening for malignant neoplasm of colon: Secondary | ICD-10-CM

## 2019-05-06 NOTE — Telephone Encounter (Signed)
Gastroenterology Pre-Procedure Review  Request Date: 06/02/19 Requesting Physician: Dr. Allen Norris  PATIENT REVIEW QUESTIONS: The patient responded to the following health history questions as indicated:    1. Are you having any GI issues? yes (change in bowel pt stated she thinks it because her metformin.  officered an appt declined at this time.) 2. Do you have a personal history of Polyps? no 3. Do you have a family history of Colon Cancer or Polyps? unsure of family history 4. Diabetes Mellitus? yes (yes type 2 oral meds) 5. Joint replacements in the past 12 months?no 6. Major health problems in the past 3 months?no 7. Any artificial heart valves, MVP, or defibrillator?no    MEDICATIONS & ALLERGIES:    Patient reports the following regarding taking any anticoagulation/antiplatelet therapy:   Plavix, Coumadin, Eliquis, Xarelto, Lovenox, Pradaxa, Brilinta, or Effient? no Aspirin? no  Patient confirms/reports the following medications:  Current Outpatient Medications  Medication Sig Dispense Refill  . ALPRAZolam (XANAX) 0.25 MG tablet Take 1 tablet (0.25 mg total) by mouth at bedtime as needed for anxiety (weeknights after 6pm). 30 tablet 1  . atenolol (TENORMIN) 25 MG tablet Take one tablet by mouth daily 30 tablet 3  . atorvastatin (LIPITOR) 20 MG tablet Take 1 tablet (20 mg total) by mouth daily. 30 tablet 3  . blood glucose meter kit and supplies KIT Dispense based on patient and insurance preference. Use up to four times daily as directed. (FOR ICD-9 250.00, 250.01). 1 each 0  . cetirizine (ZYRTEC) 5 MG tablet Take 5 mg by mouth daily.    . chlorpheniramine (CHLOR-TRIMETON) 4 MG tablet Take 4 mg by mouth daily.    . cyclobenzaprine (FLEXERIL) 5 MG tablet Take 1 tablet (5 mg total) by mouth 2 (two) times daily as needed. 45 tablet 1  . glimepiride (AMARYL) 1 MG tablet Take 1 tablet (1 mg total) by mouth daily with breakfast. 30 tablet 3  . glucose blood (CONTOUR NEXT TEST) test strip  Blood sugar testing testing three times daily  DX E11.65 100 each 11  . ibuprofen (ADVIL,MOTRIN) 600 MG tablet Take 1 tablet (600 mg total) by mouth every 6 (six) hours as needed for moderate pain. 20 tablet 0  . loratadine (CLARITIN) 10 MG tablet Take 10 mg by mouth daily as needed.     . metFORMIN (GLUCOPHAGE) 500 MG tablet Take 1 tablet (500 mg total) by mouth 2 (two) times daily with a meal. 60 tablet 5  . triamcinolone (KENALOG) 0.025 % cream Apply 1 application topically 2 (two) times daily. 80 g 2   No current facility-administered medications for this visit.     Patient confirms/reports the following allergies:  No Known Allergies  No orders of the defined types were placed in this encounter.   AUTHORIZATION INFORMATION Primary Insurance: 1D#: Group #:  Secondary Insurance: 1D#: Group #:  SCHEDULE INFORMATION: Date: 06/02/19 Time: Location:[facility]

## 2019-05-13 NOTE — Progress Notes (Signed)
Normal flora bacteria. Will treat if patient becomes symptomatic.

## 2019-05-29 ENCOUNTER — Other Ambulatory Visit: Payer: Self-pay

## 2019-05-29 ENCOUNTER — Other Ambulatory Visit
Admission: RE | Admit: 2019-05-29 | Discharge: 2019-05-29 | Disposition: A | Discharge status: Home / Self Care | Payer: BC Managed Care – PPO | Source: Ambulatory Visit | Attending: Gastroenterology | Admitting: Gastroenterology

## 2019-05-29 DIAGNOSIS — Z01812 Encounter for preprocedural laboratory examination: Secondary | ICD-10-CM | POA: Diagnosis not present

## 2019-05-29 DIAGNOSIS — Z20828 Contact with and (suspected) exposure to other viral communicable diseases: Secondary | ICD-10-CM | POA: Insufficient documentation

## 2019-05-30 LAB — SARS CORONAVIRUS 2 (TAT 6-24 HRS): SARS Coronavirus 2: NEGATIVE

## 2019-06-02 ENCOUNTER — Ambulatory Visit
Admission: RE | Admit: 2019-06-02 | Discharge: 2019-06-02 | Disposition: A | Discharge status: Home / Self Care | Payer: BC Managed Care – PPO | Attending: Gastroenterology | Admitting: Gastroenterology

## 2019-06-02 ENCOUNTER — Ambulatory Visit: Payer: BC Managed Care – PPO | Admitting: Anesthesiology

## 2019-06-02 ENCOUNTER — Other Ambulatory Visit: Payer: Self-pay

## 2019-06-02 ENCOUNTER — Encounter
Admission: RE | Disposition: A | Discharge status: Home / Self Care | Payer: Self-pay | Source: Home / Self Care | Attending: Gastroenterology

## 2019-06-02 DIAGNOSIS — Z79899 Other long term (current) drug therapy: Secondary | ICD-10-CM | POA: Insufficient documentation

## 2019-06-02 DIAGNOSIS — M1990 Unspecified osteoarthritis, unspecified site: Secondary | ICD-10-CM | POA: Diagnosis not present

## 2019-06-02 DIAGNOSIS — Z6841 Body Mass Index (BMI) 40.0 and over, adult: Secondary | ICD-10-CM | POA: Insufficient documentation

## 2019-06-02 DIAGNOSIS — Z7984 Long term (current) use of oral hypoglycemic drugs: Secondary | ICD-10-CM | POA: Diagnosis not present

## 2019-06-02 DIAGNOSIS — F419 Anxiety disorder, unspecified: Secondary | ICD-10-CM | POA: Diagnosis not present

## 2019-06-02 DIAGNOSIS — Z1211 Encounter for screening for malignant neoplasm of colon: Secondary | ICD-10-CM

## 2019-06-02 DIAGNOSIS — G4730 Sleep apnea, unspecified: Secondary | ICD-10-CM | POA: Diagnosis not present

## 2019-06-02 DIAGNOSIS — E785 Hyperlipidemia, unspecified: Secondary | ICD-10-CM | POA: Diagnosis not present

## 2019-06-02 DIAGNOSIS — E119 Type 2 diabetes mellitus without complications: Secondary | ICD-10-CM | POA: Diagnosis not present

## 2019-06-02 DIAGNOSIS — K621 Rectal polyp: Secondary | ICD-10-CM | POA: Diagnosis not present

## 2019-06-02 DIAGNOSIS — K635 Polyp of colon: Secondary | ICD-10-CM | POA: Diagnosis not present

## 2019-06-02 DIAGNOSIS — I10 Essential (primary) hypertension: Secondary | ICD-10-CM | POA: Diagnosis not present

## 2019-06-02 DIAGNOSIS — K640 First degree hemorrhoids: Secondary | ICD-10-CM | POA: Insufficient documentation

## 2019-06-02 DIAGNOSIS — E782 Mixed hyperlipidemia: Secondary | ICD-10-CM | POA: Diagnosis not present

## 2019-06-02 DIAGNOSIS — D128 Benign neoplasm of rectum: Secondary | ICD-10-CM | POA: Diagnosis not present

## 2019-06-02 DIAGNOSIS — E1165 Type 2 diabetes mellitus with hyperglycemia: Secondary | ICD-10-CM

## 2019-06-02 HISTORY — PX: COLONOSCOPY WITH PROPOFOL: SHX5780

## 2019-06-02 LAB — POCT PREGNANCY, URINE: Preg Test, Ur: NEGATIVE

## 2019-06-02 LAB — GLUCOSE, CAPILLARY: Glucose-Capillary: 149 mg/dL — ABNORMAL HIGH (ref 70–99)

## 2019-06-02 SURGERY — COLONOSCOPY WITH PROPOFOL
Anesthesia: General

## 2019-06-02 MED ORDER — PROPOFOL 500 MG/50ML IV EMUL
INTRAVENOUS | Status: DC | PRN
  Administered 2019-06-02: 140 ug/kg/min via INTRAVENOUS

## 2019-06-02 MED ORDER — SODIUM CHLORIDE 0.9 % IV SOLN
INTRAVENOUS | Status: DC
  Administered 2019-06-02: 10:00:00 via INTRAVENOUS

## 2019-06-02 MED ORDER — PROPOFOL 10 MG/ML IV BOLUS
INTRAVENOUS | Status: DC | PRN
  Administered 2019-06-02: 70 mg via INTRAVENOUS

## 2019-06-02 MED ORDER — GLIMEPIRIDE 1 MG PO TABS: 1.0000 mg | ORAL_TABLET | Freq: Every day | ORAL | 3 refills | Status: DC

## 2019-06-02 NOTE — Anesthesia Preprocedure Evaluation (Addendum)
Anesthesia Evaluation  Patient identified by MRN, date of birth, ID band Patient awake    Reviewed: Allergy & Precautions, NPO status , Patient's Chart, lab work & pertinent test results, reviewed documented beta blocker date and time   Airway Mallampati: III  TM Distance: <3 FB     Dental  (+) Chipped   Pulmonary sleep apnea ,    Pulmonary exam normal        Cardiovascular hypertension, Pt. on medications and Pt. on home beta blockers Normal cardiovascular exam     Neuro/Psych  Headaches, PSYCHIATRIC DISORDERS Anxiety    GI/Hepatic negative GI ROS, Neg liver ROS,   Endo/Other  diabetes  Renal/GU negative Renal ROS  negative genitourinary   Musculoskeletal  (+) Arthritis , Osteoarthritis,    Abdominal Normal abdominal exam  (+)   Peds negative pediatric ROS (+)  Hematology negative hematology ROS (+)   Anesthesia Other Findings   Reproductive/Obstetrics                            Anesthesia Physical Anesthesia Plan  ASA: III  Anesthesia Plan: General   Post-op Pain Management:    Induction: Intravenous  PONV Risk Score and Plan: Propofol infusion  Airway Management Planned: Nasal Cannula  Additional Equipment:   Intra-op Plan:   Post-operative Plan:   Informed Consent: I have reviewed the patients History and Physical, chart, labs and discussed the procedure including the risks, benefits and alternatives for the proposed anesthesia with the patient or authorized representative who has indicated his/her understanding and acceptance.     Dental advisory given  Plan Discussed with: CRNA and Surgeon  Anesthesia Plan Comments:         Anesthesia Quick Evaluation

## 2019-06-02 NOTE — Transfer of Care (Signed)
Immediate Anesthesia Transfer of Care Note  Patient: Rebecca Henry  Procedure(s) Performed: COLONOSCOPY WITH PROPOFOL (N/A )  Patient Location: PACU  Anesthesia Type:General  Level of Consciousness: sedated  Airway & Oxygen Therapy: Patient Spontanous Breathing  Post-op Assessment: Report given to RN and Post -op Vital signs reviewed and stable  Post vital signs: Reviewed and stable  Last Vitals:  Vitals Value Taken Time  BP 99/55 06/02/19 1031  Temp 36.5 C 06/02/19 1031  Pulse 77 06/02/19 1031  Resp 16 06/02/19 1031  SpO2 96 % 06/02/19 1031    Last Pain:  Vitals:   06/02/19 1030  TempSrc: Tympanic  PainSc: Asleep         Complications: No apparent anesthesia complications

## 2019-06-02 NOTE — H&P (Signed)
Lucilla Lame, MD [facility] [address], [place], [place] [address] Phone: [phone] Fax : [phone]  Primary Care Physician:  Ronnell Freshwater, NP Primary Gastroenterologist:  Dr. Allen Norris  Pre-Procedure History & Physical: HPI:  Rebecca Henry is a 53 y.o. female is here for a screening colonoscopy.   Past Medical History:  Diagnosis Date  . Allergic rhinitis   . Anxiety   . Hyperlipidemia   . Sleep apnea    Patient states she doesn't have it but it was in history. Did sleep study years ago    Past Surgical History:  Procedure Laterality Date  . CESAREAN SECTION      Prior to Admission medications   Medication Sig Start Date End Date Taking? Authorizing Provider  ALPRAZolam (XANAX) 0.25 MG tablet Take 1 tablet (0.25 mg total) by mouth at bedtime as needed for anxiety (weeknights after 6pm). 04/03/19  Yes Ronnell Freshwater, NP  atenolol (TENORMIN) 25 MG tablet Take one tablet by mouth daily 11/03/18  Yes Boscia, Heather E, NP  atorvastatin (LIPITOR) 20 MG tablet Take 1 tablet (20 mg total) by mouth daily. 04/03/19  Yes Boscia, Greer Ee, NP  chlorpheniramine (CHLOR-TRIMETON) 4 MG tablet Take 4 mg by mouth daily.   Yes [provider]  cyclobenzaprine (FLEXERIL) 5 MG tablet Take 1 tablet (5 mg total) by mouth 2 (two) times daily as needed. 07/15/18  Yes Ronnell Freshwater, NP  glimepiride (AMARYL) 1 MG tablet Take 1 tablet (1 mg total) by mouth daily with breakfast. 01/29/19  Yes Boscia, Heather E, NP  ibuprofen (ADVIL,MOTRIN) 600 MG tablet Take 1 tablet (600 mg total) by mouth every 6 (six) hours as needed for moderate pain. 08/18/18  Yes Boscia, Greer Ee, NP  loratadine (CLARITIN) 10 MG tablet Take 10 mg by mouth daily as needed.    Yes [provider]  metFORMIN (GLUCOPHAGE) 500 MG tablet Take 1 tablet (500 mg total) by mouth 2 (two) times daily with a meal. 03/10/19  Yes Boscia, Heather E, NP  triamcinolone (KENALOG) 0.025 % cream Apply 1 application  topically 2 (two) times daily. 04/03/19  Yes Ronnell Freshwater, NP  blood glucose meter kit and supplies KIT Dispense based on patient and insurance preference. Use up to four times daily as directed. (FOR ICD-9 250.00, 250.01). 08/17/18   Alfred Levins, [place], MD  cetirizine (ZYRTEC) 5 MG tablet Take 5 mg by mouth daily.    [provider]  glucose blood (CONTOUR NEXT TEST) test strip Blood sugar testing testing three times daily  DX E11.65 01/29/19   Ronnell Freshwater, NP    Allergies as of 05/06/2019  . (No Known Allergies)    Family History  Problem Relation Age of Onset  . Hypertension Mother   . Diabetes Maternal Grandmother   . Hypothyroidism Maternal Grandmother     Social History   Socioeconomic History  . Marital status: Single    Spouse name: Not on file  . Number of children: Not on file  . Years of education: Not on file  . Highest education level: Not on file  Occupational History  . Not on file  Social Needs  . Financial resource strain: Not on file  . Food insecurity    Worry: Not on file    Inability: Not on file  . Transportation needs    Medical: Not on file    Non-medical: Not on file  Tobacco Use  . Smoking status: Never Smoker  .  Smokeless tobacco: Never Used  Substance and Sexual Activity  . Alcohol use: Yes    Comment: very rarely  . Drug use: No  . Sexual activity: Not on file  Lifestyle  . Physical activity    Days per week: Not on file    Minutes per session: Not on file  . Stress: Not on file  Relationships  . Social Herbalist on phone: Not on file    Gets together: Not on file    Attends religious service: Not on file    Active member of club or organization: Not on file    Attends meetings of clubs or organizations: Not on file    Relationship status: Not on file  . Intimate partner violence    Fear of current or ex partner: Not on file    Emotionally abused: Not on file    Physically abused: Not on file     Forced sexual activity: Not on file  Other Topics Concern  . Not on file  Social History Narrative  . Not on file    Review of Systems: See HPI, otherwise negative ROS  Physical Exam: BP 129/80   Pulse 78   Temp 98 F (36.7 C) (Tympanic)   Resp 20   Ht '5\' 2"'$  (1.575 m)   Wt 117.9 kg   SpO2 95%   BMI 47.55 kg/m  General:   Alert,  pleasant and cooperative in NAD Head:  Normocephalic and atraumatic. Neck:  Supple; no masses or thyromegaly. Lungs:  Clear throughout to auscultation.    Heart:  Regular rate and rhythm. Abdomen:  Soft, nontender and nondistended. Normal bowel sounds, without guarding, and without rebound.   Neurologic:  Alert and  oriented x4;  grossly normal neurologically.  Impression/Plan: Rebecca Henry is now here to undergo a screening colonoscopy.  Risks, benefits, and alternatives regarding colonoscopy have been reviewed with the patient.  Questions have been answered.  All parties agreeable.

## 2019-06-02 NOTE — Anesthesia Post-op Follow-up Note (Signed)
Anesthesia QCDR form completed.        

## 2019-06-02 NOTE — Op Note (Signed)
[facility] Gastroenterology Patient Name: Rebecca KinnierKaren Henry Procedure Date: 06/02/2019 9:49 AM MRN: [id number] Account #: [id number] Date of Birth: 1965/09/14 Admit Type: Outpatient Age: 53 [facility] ROOM 4 Gender: Female Note Status: Finalized Procedure:            Colonoscopy Indications:          Screening for colorectal malignant neoplasm Providers:            Midge Miniumarren Bentleigh Waren MD, MD Referring MD:         Lyndon CodeFozia M. Khan, MD (Referring MD) Medicines:            Propofol per Anesthesia Complications:        No immediate complications. Procedure:            Pre-Anesthesia Assessment:                       - Prior to the procedure, a History and Physical was                        performed, and patient medications and allergies were                        reviewed. The patient's tolerance of previous                        anesthesia was also reviewed. The risks and benefits of                        the procedure and the sedation options and risks were                        discussed with the patient. All questions were                        answered, and informed consent was obtained. Prior                        Anticoagulants: The patient has taken no previous                        anticoagulant or antiplatelet agents. ASA Grade                        Assessment: II - A patient with mild systemic disease.                        After reviewing the risks and benefits, the patient was                        deemed in satisfactory condition to undergo the                        procedure.                       After obtaining informed consent, the colonoscope was                        passed under direct vision. Throughout the procedure,  the patient's blood pressure, pulse, and oxygen                        saturations were monitored continuously. The                        Colonoscope was introduced through the anus and           advanced to the the cecum, identified by appendiceal                        orifice and ileocecal valve. The colonoscopy was                        performed without difficulty. The patient tolerated the                        procedure well. The quality of the bowel preparation                        was fair. Findings:      The perianal and digital rectal examinations were normal.      A 4 mm polyp was found in the rectum. The polyp was sessile. The polyp       was removed with a cold biopsy forceps. Resection and retrieval were       complete.      Non-bleeding internal hemorrhoids were found during retroflexion. The       hemorrhoids were Grade I (internal hemorrhoids that do not prolapse). Impression:           - Preparation of the colon was fair.                       - One 4 mm polyp in the rectum, removed with a cold                        biopsy forceps. Resected and retrieved.                       - Non-bleeding internal hemorrhoids. Recommendation:       - Discharge patient to home.                       - Resume previous diet.                       - Continue present medications.                       - Await pathology results.                       - Repeat colonoscopy in 5 years if polyp adenoma and 10                        years if hyperplastic Procedure Code(s):    --- Professional ---                       [phone], Colonoscopy, flexible; with biopsy, single or  multiple Diagnosis Code(s):    --- Professional ---                       Z12.11, Encounter for screening for malignant neoplasm                        of colon                       K62.1, Rectal polyp CPT copyright 2019 American Medical Association. All rights reserved. The codes documented in this report are preliminary and upon coder review may  be revised to meet current compliance requirements. Midge Minium MD, MD 06/02/2019 10:28:10 AM This report has been signed  electronically. Number of Addenda: 0 Note Initiated On: 06/02/2019 9:49 AM Scope Withdrawal Time: 0 hours 8 minutes 53 seconds  Total Procedure Duration: 0 hours 13 minutes 26 seconds  Estimated Blood Loss: Estimated blood loss: none.      [facility]

## 2019-06-03 ENCOUNTER — Encounter: Payer: Self-pay | Admitting: Gastroenterology

## 2019-06-03 LAB — SURGICAL PATHOLOGY

## 2019-06-03 NOTE — Anesthesia Postprocedure Evaluation (Signed)
Anesthesia Post Note  Patient: Rebecca Henry  Procedure(s) Performed: COLONOSCOPY WITH PROPOFOL (N/A )  Patient location during evaluation: Endoscopy Anesthesia Type: General Level of consciousness: awake and alert and oriented Pain management: pain level controlled Vital Signs Assessment: post-procedure vital signs reviewed and stable Respiratory status: spontaneous breathing Cardiovascular status: blood pressure returned to baseline Anesthetic complications: no     Last Vitals:  Vitals:   06/02/19 1041 06/02/19 1051  BP: (!) 87/48 111/60  Pulse: 78 78  Resp: 14 (!) 24  Temp:    SpO2: 97% 99%    Last Pain:  Vitals:   06/03/19 0738  TempSrc:   PainSc: 0-No pain                 Shamonique Battiste

## 2019-06-10 ENCOUNTER — Other Ambulatory Visit: Payer: Self-pay

## 2019-06-10 DIAGNOSIS — Z20822 Contact with and (suspected) exposure to covid-19: Secondary | ICD-10-CM

## 2019-06-11 LAB — NOVEL CORONAVIRUS, NAA: SARS-CoV-2, NAA: NOT DETECTED

## 2019-07-06 ENCOUNTER — Other Ambulatory Visit: Payer: Self-pay

## 2019-07-06 MED ORDER — ATENOLOL 25 MG PO TABS: ORAL_TABLET | ORAL | 3 refills | Status: DC

## 2019-07-08 ENCOUNTER — Telehealth: Payer: Self-pay

## 2019-07-08 NOTE — Telephone Encounter (Signed)
Confirmed appointment with patient. klh °

## 2019-07-10 ENCOUNTER — Ambulatory Visit: Payer: BC Managed Care – PPO | Admitting: Nurse Practitioner

## 2019-08-06 ENCOUNTER — Telehealth: Payer: Self-pay

## 2019-08-06 NOTE — Telephone Encounter (Signed)
LMOM FOR PT TO SCREEN AND CONFIRM 08-11-19 OV.

## 2019-08-11 ENCOUNTER — Ambulatory Visit: Payer: BC Managed Care – PPO | Admitting: Nurse Practitioner

## 2019-08-11 ENCOUNTER — Encounter (INDEPENDENT_AMBULATORY_CARE_PROVIDER_SITE_OTHER): Payer: Self-pay

## 2019-08-11 ENCOUNTER — Encounter: Payer: Self-pay | Admitting: Nurse Practitioner

## 2019-08-11 ENCOUNTER — Other Ambulatory Visit: Payer: Self-pay

## 2019-08-11 VITALS — BP 132/74 | HR 80 | Temp 97.4°F | Resp 16 | Ht 62.0 in | Wt 255.4 lb

## 2019-08-11 DIAGNOSIS — M545 Low back pain, unspecified: Secondary | ICD-10-CM

## 2019-08-11 DIAGNOSIS — F411 Generalized anxiety disorder: Secondary | ICD-10-CM | POA: Diagnosis not present

## 2019-08-11 DIAGNOSIS — F410 Panic disorder [episodic paroxysmal anxiety] without agoraphobia: Secondary | ICD-10-CM

## 2019-08-11 DIAGNOSIS — E1165 Type 2 diabetes mellitus with hyperglycemia: Secondary | ICD-10-CM

## 2019-08-11 DIAGNOSIS — E782 Mixed hyperlipidemia: Secondary | ICD-10-CM | POA: Diagnosis not present

## 2019-08-11 LAB — POCT GLYCOSYLATED HEMOGLOBIN (HGB A1C): Hemoglobin A1C: 6.6 % — AB (ref 4.0–5.6)

## 2019-08-11 MED ORDER — ATORVASTATIN CALCIUM 20 MG PO TABS: 20.0000 mg | ORAL_TABLET | Freq: Every day | ORAL | 3 refills | Status: DC

## 2019-08-11 MED ORDER — GLIMEPIRIDE 1 MG PO TABS: 1.0000 mg | ORAL_TABLET | Freq: Every day | ORAL | 3 refills | Status: DC

## 2019-08-11 MED ORDER — METFORMIN HCL 500 MG PO TABS: 500.0000 mg | ORAL_TABLET | Freq: Two times a day (BID) | ORAL | 5 refills | Status: DC

## 2019-08-11 MED ORDER — ALPRAZOLAM 0.25 MG PO TABS: 0.2500 mg | ORAL_TABLET | Freq: Every evening | ORAL | 1 refills | Status: DC | PRN

## 2019-08-11 MED ORDER — CYCLOBENZAPRINE HCL 5 MG PO TABS: 5.0000 mg | ORAL_TABLET | Freq: Two times a day (BID) | ORAL | 1 refills | Status: DC | PRN

## 2019-08-11 NOTE — Progress Notes (Signed)
[facility] [place], [place] [address]  Internal MEDICINE  Office Visit Note  Patient Name: Rebecca Henry  [id number]  [id number]  Date of Service: 08/15/2019  Chief Complaint  Patient presents with  . Hypertension  . Hyperlipidemia  . Diabetes    Rebecca Henry presents today for a routine follow-up. She reports that she has occasionally been skipping her glyburide doses because when she takes it and is unable to eat lunch, her blood sugar "bottoms out." She states that her work is very stressful, and she often cannot stop for lunch. Her HgbA1c is 6.6 today. Rebecca Henry also reports that her "back has been flaring up," and that the pain is well controlled with as needed cyclobenzaprine.      Current Medication: Outpatient Encounter Medications as of 08/11/2019  Medication Sig  . ALPRAZolam (XANAX) 0.25 MG tablet Take 1 tablet (0.25 mg total) by mouth at bedtime as needed for anxiety (weeknights after 6pm).  Marland Kitchen atenolol (TENORMIN) 25 MG tablet Take one tablet by mouth daily  . atorvastatin (LIPITOR) 20 MG tablet Take 1 tablet (20 mg total) by mouth daily.  . blood glucose meter kit and supplies KIT Dispense based on patient and insurance preference. Use up to four times daily as directed. (FOR ICD-9 250.00, 250.01).  . cetirizine (ZYRTEC) 5 MG tablet Take 5 mg by mouth daily.  . chlorpheniramine (CHLOR-TRIMETON) 4 MG tablet Take 4 mg by mouth daily.  . cyclobenzaprine (FLEXERIL) 5 MG tablet Take 1 tablet (5 mg total) by mouth 2 (two) times daily as needed.  Marland Kitchen glimepiride (AMARYL) 1 MG tablet Take 1 tablet (1 mg total) by mouth daily with breakfast.  . glucose blood (CONTOUR NEXT TEST) test strip Blood sugar testing testing three times daily  DX E11.65  . ibuprofen (ADVIL,MOTRIN) 600 MG tablet Take 1 tablet (600 mg total) by mouth every 6 (six) hours as needed for moderate pain.  Marland Kitchen loratadine (CLARITIN) 10 MG tablet Take 10 mg by mouth daily as needed.   .  metFORMIN (GLUCOPHAGE) 500 MG tablet Take 1 tablet (500 mg total) by mouth 2 (two) times daily with a meal.  . triamcinolone (KENALOG) 0.025 % cream Apply 1 application topically 2 (two) times daily.  . [DISCONTINUED] ALPRAZolam (XANAX) 0.25 MG tablet Take 1 tablet (0.25 mg total) by mouth at bedtime as needed for anxiety (weeknights after 6pm).  . [DISCONTINUED] atorvastatin (LIPITOR) 20 MG tablet Take 1 tablet (20 mg total) by mouth daily.  . [DISCONTINUED] cyclobenzaprine (FLEXERIL) 5 MG tablet Take 1 tablet (5 mg total) by mouth 2 (two) times daily as needed.  . [DISCONTINUED] glimepiride (AMARYL) 1 MG tablet Take 1 tablet (1 mg total) by mouth daily with breakfast.  . [DISCONTINUED] metFORMIN (GLUCOPHAGE) 500 MG tablet Take 1 tablet (500 mg total) by mouth 2 (two) times daily with a meal.   No facility-administered encounter medications on file as of 08/11/2019.    Surgical History: Past Surgical History:  Procedure Laterality Date  . CESAREAN SECTION    . COLONOSCOPY WITH PROPOFOL N/A 06/02/2019   Procedure: COLONOSCOPY WITH PROPOFOL;  Surgeon: Lucilla Lame, MD;  Location: [facility] ENDOSCOPY;  Service: Endoscopy;  Laterality: N/A;    Medical History: Past Medical History:  Diagnosis Date  . Allergic rhinitis   . Anxiety   . Diabetes mellitus without complication ([place])   . Hyperlipidemia   . Sleep apnea    Patient states she doesn't have it but it was in history.  Did sleep study years ago    Family History: Family History  Problem Relation Age of Onset  . Hypertension Mother   . Diabetes Maternal Grandmother   . Hypothyroidism Maternal Grandmother     Social History   Socioeconomic History  . Marital status: Single    Spouse name: Not on file  . Number of children: Not on file  . Years of education: Not on file  . Highest education level: Not on file  Occupational History  . Not on file  Tobacco Use  . Smoking status: Never Smoker  . Smokeless tobacco: Never Used   Substance and Sexual Activity  . Alcohol use: Not Currently  . Drug use: No  . Sexual activity: Not on file  Other Topics Concern  . Not on file  Social History Narrative  . Not on file   Social Determinants of Health   Financial Resource Strain:   . Difficulty of Paying Living Expenses: Not on file  Food Insecurity:   . Worried About Charity fundraiser in the Last Year: Not on file  . Ran Out of Food in the Last Year: Not on file  Transportation Needs:   . Lack of Transportation (Medical): Not on file  . Lack of Transportation (Non-Medical): Not on file  Physical Activity:   . Days of Exercise per Week: Not on file  . Minutes of Exercise per Session: Not on file  Stress:   . Feeling of Stress : Not on file  Social Connections:   . Frequency of Communication with Friends and Family: Not on file  . Frequency of Social Gatherings with Friends and Family: Not on file  . Attends Religious Services: Not on file  . Active Member of Clubs or Organizations: Not on file  . Attends Archivist Meetings: Not on file  . Marital Status: Not on file  Intimate Partner Violence:   . Fear of Current or Ex-Partner: Not on file  . Emotionally Abused: Not on file  . Physically Abused: Not on file  . Sexually Abused: Not on file      Review of Systems  Constitutional: Negative for activity change, chills, fatigue and unexpected weight change.  HENT: Negative for congestion, postnasal drip, rhinorrhea, sneezing and sore throat.   Respiratory: Negative for cough, chest tightness, shortness of breath and wheezing.   Cardiovascular: Negative for chest pain and palpitations.  Gastrointestinal: Negative for abdominal pain, constipation, diarrhea, nausea and vomiting.  Endocrine: Negative for cold intolerance, heat intolerance, polydipsia and polyuria.       Blood sugars doing well   Musculoskeletal: Positive for back pain and myalgias. Negative for arthralgias, joint swelling and  neck pain.  Skin: Negative for rash.  Allergic/Immunologic: Negative for environmental allergies.  Neurological: Negative for dizziness, tremors, numbness and headaches.  Hematological: Negative for adenopathy. Does not bruise/bleed easily.  Psychiatric/Behavioral: Negative for behavioral problems (Depression), sleep disturbance and suicidal ideas. The patient is nervous/anxious.     Today's Vitals   08/11/19 1621  BP: 132/74  Pulse: 80  Resp: 16  Temp: (!) 97.4 F (36.3 C)  SpO2: 97%  Weight: 255 lb 6.4 oz (115.8 kg)  Height: _0  (1.575 m)   Body mass index is 46.71 kg/m.  Physical Exam Vitals reviewed.  Constitutional:      General: She is not in acute distress.    Appearance: She is obese.  HENT:     Head: Normocephalic and atraumatic.     Nose:  Nose normal.  Eyes:     Pupils: Pupils are equal, round, and reactive to light.  Cardiovascular:     Rate and Rhythm: Normal rate and regular rhythm.  Pulmonary:     Effort: Pulmonary effort is normal.     Breath sounds: Normal breath sounds.  Abdominal:     Palpations: Abdomen is soft.  Musculoskeletal:     Cervical back: Normal range of motion and neck supple.  Skin:    General: Skin is warm and dry.  Neurological:     Mental Status: She is alert and oriented to person, place, and time.  Psychiatric:        Mood and Affect: Mood normal.        Behavior: Behavior normal.        Thought Content: Thought content normal.        Judgment: Judgment normal.    Assessment/Plan: 1. Type 2 diabetes mellitus with hyperglycemia, without long-term current use of insulin (HCC) - POCT HgB A1C 6.6 today. Continue with glimepiride and metformin as prescribed. Refills provided today.  - glimepiride (AMARYL) 1 MG tablet; Take 1 tablet (1 mg total) by mouth daily with breakfast.  Dispense: 30 tablet; Refill: 3 - metFORMIN (GLUCOPHAGE) 500 MG tablet; Take 1 tablet (500 mg total) by mouth 2 (two) times daily with a meal.  Dispense: 60  tablet; Refill: 5  2. Mixed hyperlipidemia Stable.  - atorvastatin (LIPITOR) 20 MG tablet; Take 1 tablet (20 mg total) by mouth daily.  Dispense: 30 tablet; Refill: 3  3. Low back pain at multiple sites May take cyclobenzaprine 16m up to twice daily if needed for low back pain. Refills provided today.  - cyclobenzaprine (FLEXERIL) 5 MG tablet; Take 1 tablet (5 mg total) by mouth 2 (two) times daily as needed.  Dispense: 45 tablet; Refill: 1  4. Generalized anxiety disorder with panic attacks May take alprazolam 0.2108mat bedtime if needed for acute anxiety. Refills provided today.  - ALPRAZolam (XANAX) 0.25 MG tablet; Take 1 tablet (0.25 mg total) by mouth at bedtime as needed for anxiety (weeknights after 6pm).  Dispense: 30 tablet; Refill: 1  General Counseling: Kawaylynn benefielnderstanding of the findings of todays visit and agrees with plan of treatment. I have discussed any further diagnostic evaluation that may be needed or ordered today. We also reviewed her medications today. she has been encouraged to call the office with any questions or concerns that should arise related to todays visit.  Diabetes Counseling:  1. Addition of ACE inh/ ARB'S for nephroprotection. Microalbumin is updated  2. Diabetic foot care, prevention of complications. Podiatry consult 3. Exercise and lose weight.  4. Diabetic eye examination, Diabetic eye exam is updated  5. Monitor blood sugar closlely. nutrition counseling.  6. Sign and symptoms of hypoglycemia including shaking sweating,confusion and headaches.  This patient was seen by HeLeretha PolNP Collaboration with Dr FoLavera Guises a part of collaborative care agreement  Orders Placed This Encounter  Procedures  . POCT HgB A1C    Meds ordered this encounter  Medications  . cyclobenzaprine (FLEXERIL) 5 MG tablet    Sig: Take 1 tablet (5 mg total) by mouth 2 (two) times daily as needed.    Dispense:  45 tablet    Refill:  1    Order  Specific Question:   Supervising Provider    Answer:   KHLavera Guise1[[id number]]. ALPRAZolam (XANAX) 0.25 MG tablet    Sig: Take  1 tablet (0.25 mg total) by mouth at bedtime as needed for anxiety (weeknights after 6pm).    Dispense:  30 tablet    Refill:  1    Please hold for when patient needs fill. thanks    Order Specific Question:   Supervising Provider    Answer:   Lavera Guise [[id number]]  . atorvastatin (LIPITOR) 20 MG tablet    Sig: Take 1 tablet (20 mg total) by mouth daily.    Dispense:  30 tablet    Refill:  3    Order Specific Question:   Supervising Provider    Answer:   Lavera Guise [[id number]]  . glimepiride (AMARYL) 1 MG tablet    Sig: Take 1 tablet (1 mg total) by mouth daily with breakfast.    Dispense:  30 tablet    Refill:  3    Order Specific Question:   Supervising Provider    Answer:   Lavera Guise [place]  . metFORMIN (GLUCOPHAGE) 500 MG tablet    Sig: Take 1 tablet (500 mg total) by mouth 2 (two) times daily with a meal.    Dispense:  60 tablet    Refill:  5    Order Specific Question:   Supervising Provider    Answer:   Lavera Guise [[id number]]    Total Time spent: 31 Minutes      Dr Lavera Guise Internal medicine

## 2019-08-13 DIAGNOSIS — S50811A Abrasion of right forearm, initial encounter: Secondary | ICD-10-CM | POA: Diagnosis not present

## 2019-08-13 DIAGNOSIS — W010XXA Fall on same level from slipping, tripping and stumbling without subsequent striking against object, initial encounter: Secondary | ICD-10-CM | POA: Diagnosis not present

## 2019-08-13 DIAGNOSIS — S0511XA Contusion of eyeball and orbital tissues, right eye, initial encounter: Secondary | ICD-10-CM | POA: Diagnosis not present

## 2019-08-13 DIAGNOSIS — S6991XA Unspecified injury of right wrist, hand and finger(s), initial encounter: Secondary | ICD-10-CM | POA: Diagnosis not present

## 2019-08-13 DIAGNOSIS — M79641 Pain in right hand: Secondary | ICD-10-CM | POA: Diagnosis not present

## 2019-08-13 DIAGNOSIS — Z23 Encounter for immunization: Secondary | ICD-10-CM | POA: Diagnosis not present

## 2019-11-09 ENCOUNTER — Ambulatory Visit: Payer: BC Managed Care – PPO | Admitting: Nurse Practitioner

## 2019-11-12 ENCOUNTER — Other Ambulatory Visit: Payer: Self-pay

## 2019-11-12 MED ORDER — ATENOLOL 25 MG PO TABS: ORAL_TABLET | ORAL | 3 refills | Status: DC

## 2019-12-01 ENCOUNTER — Telehealth: Payer: Self-pay

## 2019-12-01 NOTE — Telephone Encounter (Signed)
Conifrmed and screened for 12-03-19 ov.

## 2019-12-03 ENCOUNTER — Other Ambulatory Visit: Payer: Self-pay

## 2019-12-03 ENCOUNTER — Encounter: Payer: Self-pay | Admitting: Nurse Practitioner

## 2019-12-03 ENCOUNTER — Ambulatory Visit: Payer: BC Managed Care – PPO | Admitting: Nurse Practitioner

## 2019-12-03 VITALS — BP 136/79 | HR 76 | Temp 97.5°F | Resp 16 | Ht 62.0 in | Wt 256.6 lb

## 2019-12-03 DIAGNOSIS — F410 Panic disorder [episodic paroxysmal anxiety] without agoraphobia: Secondary | ICD-10-CM | POA: Diagnosis not present

## 2019-12-03 DIAGNOSIS — E1165 Type 2 diabetes mellitus with hyperglycemia: Secondary | ICD-10-CM | POA: Diagnosis not present

## 2019-12-03 DIAGNOSIS — F411 Generalized anxiety disorder: Secondary | ICD-10-CM | POA: Diagnosis not present

## 2019-12-03 DIAGNOSIS — E782 Mixed hyperlipidemia: Secondary | ICD-10-CM | POA: Diagnosis not present

## 2019-12-03 LAB — POCT GLYCOSYLATED HEMOGLOBIN (HGB A1C): Hemoglobin A1C: 7 % — AB (ref 4.0–5.6)

## 2019-12-03 MED ORDER — ALPRAZOLAM 0.25 MG PO TABS: 0.2500 mg | ORAL_TABLET | Freq: Every evening | ORAL | 2 refills | Status: DC | PRN

## 2019-12-03 MED ORDER — ATORVASTATIN CALCIUM 20 MG PO TABS: 20.0000 mg | ORAL_TABLET | Freq: Every day | ORAL | 5 refills | Status: DC

## 2019-12-03 NOTE — Progress Notes (Signed)
[facility] [place], [place] [address]  Internal MEDICINE  Office Visit Note  Patient Name: Rebecca Henry  [id number]  [id number]  Date of Service: 12/16/2019  Chief Complaint  Patient presents with  . Diabetes  . Hypertension  . Medication Management    takes half tab of glimepiride  . Medication Refill    alprazolam, atorvastatin   . Numbness    feet,  at the end of the day, unsure if it is everday     The patient is here for routine follow up. She is doing ok with blood sugars, though they have been more elevated recently. Her HgbA1c is 7.0 today. She states that she will skip her glyburide sometimes, when she feels as though her blood sugar is low. She states that she is not always able to eat the most balanced or make the best choices regarding her diet. She states that she is often eating at her desk and often unable to prep healthier choices.  She reports some increased anxiety. Will take low dose alprazolam 0.'25mg'$  at bedtime as needed for anxiety/insomnia. She needs to have a refill for this today.       Current Medication: Outpatient Encounter Medications as of 12/03/2019  Medication Sig  . ALPRAZolam (XANAX) 0.25 MG tablet Take 1 tablet (0.25 mg total) by mouth at bedtime as needed for anxiety (weeknights after 6pm).  Marland Kitchen atenolol (TENORMIN) 25 MG tablet Take one tablet by mouth daily  . atorvastatin (LIPITOR) 20 MG tablet Take 1 tablet (20 mg total) by mouth daily.  . blood glucose meter kit and supplies KIT Dispense based on patient and insurance preference. Use up to four times daily as directed. (FOR ICD-9 250.00, 250.01).  . cetirizine (ZYRTEC) 5 MG tablet Take 5 mg by mouth daily.  . chlorpheniramine (CHLOR-TRIMETON) 4 MG tablet Take 4 mg by mouth daily.  . cyclobenzaprine (FLEXERIL) 5 MG tablet Take 1 tablet (5 mg total) by mouth 2 (two) times daily as needed.  Marland Kitchen glimepiride (AMARYL) 1 MG tablet Take 1 tablet (1 mg total) by mouth daily  with breakfast.  . glucose blood (CONTOUR NEXT TEST) test strip Blood sugar testing testing three times daily  DX E11.65  . ibuprofen (ADVIL,MOTRIN) 600 MG tablet Take 1 tablet (600 mg total) by mouth every 6 (six) hours as needed for moderate pain.  Marland Kitchen loratadine (CLARITIN) 10 MG tablet Take 10 mg by mouth daily as needed.   . metFORMIN (GLUCOPHAGE) 500 MG tablet Take 1 tablet (500 mg total) by mouth 2 (two) times daily with a meal.  . triamcinolone (KENALOG) 0.025 % cream Apply 1 application topically 2 (two) times daily.  . [DISCONTINUED] ALPRAZolam (XANAX) 0.25 MG tablet Take 1 tablet (0.25 mg total) by mouth at bedtime as needed for anxiety (weeknights after 6pm).  . [DISCONTINUED] atorvastatin (LIPITOR) 20 MG tablet Take 1 tablet (20 mg total) by mouth daily.   No facility-administered encounter medications on file as of 12/03/2019.    Surgical History: Past Surgical History:  Procedure Laterality Date  . CESAREAN SECTION    . COLONOSCOPY WITH PROPOFOL N/A 06/02/2019   Procedure: COLONOSCOPY WITH PROPOFOL;  Surgeon: Lucilla Lame, MD;  Location: [facility] ENDOSCOPY;  Service: Endoscopy;  Laterality: N/A;    Medical History: Past Medical History:  Diagnosis Date  . Allergic rhinitis   . Anxiety   . Diabetes mellitus without complication ([place])   . Hyperlipidemia   . Sleep apnea    Patient  states she doesn't have it but it was in history. Did sleep study years ago    Family History: Family History  Problem Relation Age of Onset  . Hypertension Mother   . Diabetes Maternal Grandmother   . Hypothyroidism Maternal Grandmother     Social History   Socioeconomic History  . Marital status: Single    Spouse name: Not on file  . Number of children: Not on file  . Years of education: Not on file  . Highest education level: Not on file  Occupational History  . Not on file  Tobacco Use  . Smoking status: Never Smoker  . Smokeless tobacco: Never Used  Substance and Sexual Activity   . Alcohol use: Not Currently  . Drug use: No  . Sexual activity: Not on file  Other Topics Concern  . Not on file  Social History Narrative  . Not on file   Social Determinants of Health   Financial Resource Strain:   . Difficulty of Paying Living Expenses:   Food Insecurity:   . Worried About Charity fundraiser in the Last Year:   . Arboriculturist in the Last Year:   Transportation Needs:   . Film/video editor (Medical):   Marland Kitchen Lack of Transportation (Non-Medical):   Physical Activity:   . Days of Exercise per Week:   . Minutes of Exercise per Session:   Stress:   . Feeling of Stress :   Social Connections:   . Frequency of Communication with Friends and Family:   . Frequency of Social Gatherings with Friends and Family:   . Attends Religious Services:   . Active Member of Clubs or Organizations:   . Attends Archivist Meetings:   Marland Kitchen Marital Status:   Intimate Partner Violence:   . Fear of Current or Ex-Partner:   . Emotionally Abused:   Marland Kitchen Physically Abused:   . Sexually Abused:       Review of Systems  Constitutional: Negative for activity change, chills, fatigue and unexpected weight change.  HENT: Negative for congestion, postnasal drip, rhinorrhea, sneezing and sore throat.   Respiratory: Negative for cough, chest tightness, shortness of breath and wheezing.   Cardiovascular: Negative for chest pain and palpitations.  Gastrointestinal: Negative for abdominal pain, constipation, diarrhea, nausea and vomiting.  Endocrine: Negative for cold intolerance, heat intolerance, polydipsia and polyuria.       Blood sugars have been a bit elevated recently.   Musculoskeletal: Positive for back pain and myalgias. Negative for arthralgias, joint swelling and neck pain.  Skin: Negative for rash.  Allergic/Immunologic: Negative for environmental allergies.  Neurological: Negative for dizziness, tremors, numbness and headaches.  Hematological: Negative for  adenopathy. Does not bruise/bleed easily.  Psychiatric/Behavioral: Negative for behavioral problems (Depression), sleep disturbance and suicidal ideas. The patient is nervous/anxious.     Today's Vitals   12/03/19 1605  BP: 136/79  Pulse: 76  Resp: 16  Temp: (!) 97.5 F (36.4 C)  SpO2: 98%  Weight: 256 lb 9.6 oz (116.4 kg)  Height: '5\' 2"'$  (1.575 m)   Body mass index is 46.93 kg/m.  Physical Exam Vitals and nursing note reviewed.  Constitutional:      General: She is not in acute distress.    Appearance: Normal appearance. She is obese.  HENT:     Head: Normocephalic and atraumatic.     Nose: Nose normal.  Eyes:     Conjunctiva/sclera: Conjunctivae normal.     Pupils: Pupils  are equal, round, and reactive to light.  Cardiovascular:     Rate and Rhythm: Normal rate and regular rhythm.  Pulmonary:     Effort: Pulmonary effort is normal.     Breath sounds: Normal breath sounds.  Abdominal:     Palpations: Abdomen is soft.  Musculoskeletal:     Cervical back: Normal range of motion and neck supple.     Comments: Mild, intermittent lower back pain. Worse with standing and twisting at the waist.   Skin:    General: Skin is warm and dry.  Neurological:     Mental Status: She is alert and oriented to person, place, and time.  Psychiatric:        Attention and Perception: Attention and perception normal.        Mood and Affect: Affect normal. Mood is anxious.        Speech: Speech normal.        Behavior: Behavior normal.        Thought Content: Thought content normal.        Cognition and Memory: Cognition and memory normal.        Judgment: Judgment normal.    Assessment/Plan: 1. Type 2 diabetes mellitus with hyperglycemia, without long-term current use of insulin (HCC) - POCT HgB A1C 7.0 today. Continue diabetic medication as prescribed. Reviewed importance of limiting intake of carbohydrates and sweets in the diet and increasing protein intake. She should incorporate  exercise into daily routine.   2. Mixed hyperlipidemia Continue atorvastatin as prescribed  - atorvastatin (LIPITOR) 20 MG tablet; Take 1 tablet (20 mg total) by mouth daily.  Dispense: 30 tablet; Refill: 5  3. Generalized anxiety disorder with panic attacks May take alprazolam 0.'25mg'$  at bedtime as needed for acute anxiety. New prescription provided today.  - ALPRAZolam (XANAX) 0.25 MG tablet; Take 1 tablet (0.25 mg total) by mouth at bedtime as needed for anxiety (weeknights after 6pm).  Dispense: 30 tablet; Refill: 2  General Counseling: Taite verbalizes understanding of the findings of todays visit and agrees with plan of treatment. I have discussed any further diagnostic evaluation that may be needed or ordered today. We also reviewed her medications today. she has been encouraged to call the office with any questions or concerns that should arise related to todays visit.  Diabetes Counseling:  1. Addition of ACE inh/ ARB'S for nephroprotection. Microalbumin is updated  2. Diabetic foot care, prevention of complications. Podiatry consult 3. Exercise and lose weight.  4. Diabetic eye examination, Diabetic eye exam is updated  5. Monitor blood sugar closlely. nutrition counseling.  6. Sign and symptoms of hypoglycemia including shaking sweating,confusion and headaches.  This patient was seen by Leretha Pol FNP Collaboration with Dr Lavera Guise as a part of collaborative care agreement   Orders Placed This Encounter  Procedures  . POCT HgB A1C    Meds ordered this encounter  Medications  . ALPRAZolam (XANAX) 0.25 MG tablet    Sig: Take 1 tablet (0.25 mg total) by mouth at bedtime as needed for anxiety (weeknights after 6pm).    Dispense:  30 tablet    Refill:  2    Please hold for when patient needs fill. thanks    Order Specific Question:   Supervising Provider    Answer:   Lavera Guise [[id number]]  . atorvastatin (LIPITOR) 20 MG tablet    Sig: Take 1 tablet (20 mg total) by  mouth daily.    Dispense:  30 tablet  Refill:  5    Order Specific Question:   Supervising Provider    Answer:   Lavera Guise [[id number]]    Total time spent: 30 Minutes   Time spent includes review of chart, medications, test results, and follow up plan with the patient.      Dr Lavera Guise Internal medicine

## 2019-12-25 IMAGING — CR DG CHEST 2V
1 series · 2 of 2 positions shown · non-contrast
Comparison: Two-view chest x-ray 10/01/2014

CLINICAL DATA: Left shoulder pain.  Chest pain.

EXAM:
CHEST - 2 VIEW

[Series 1: dg chest 2 view · 0.14mm/px · 2 of 2 slices shown]
[im 1/2]
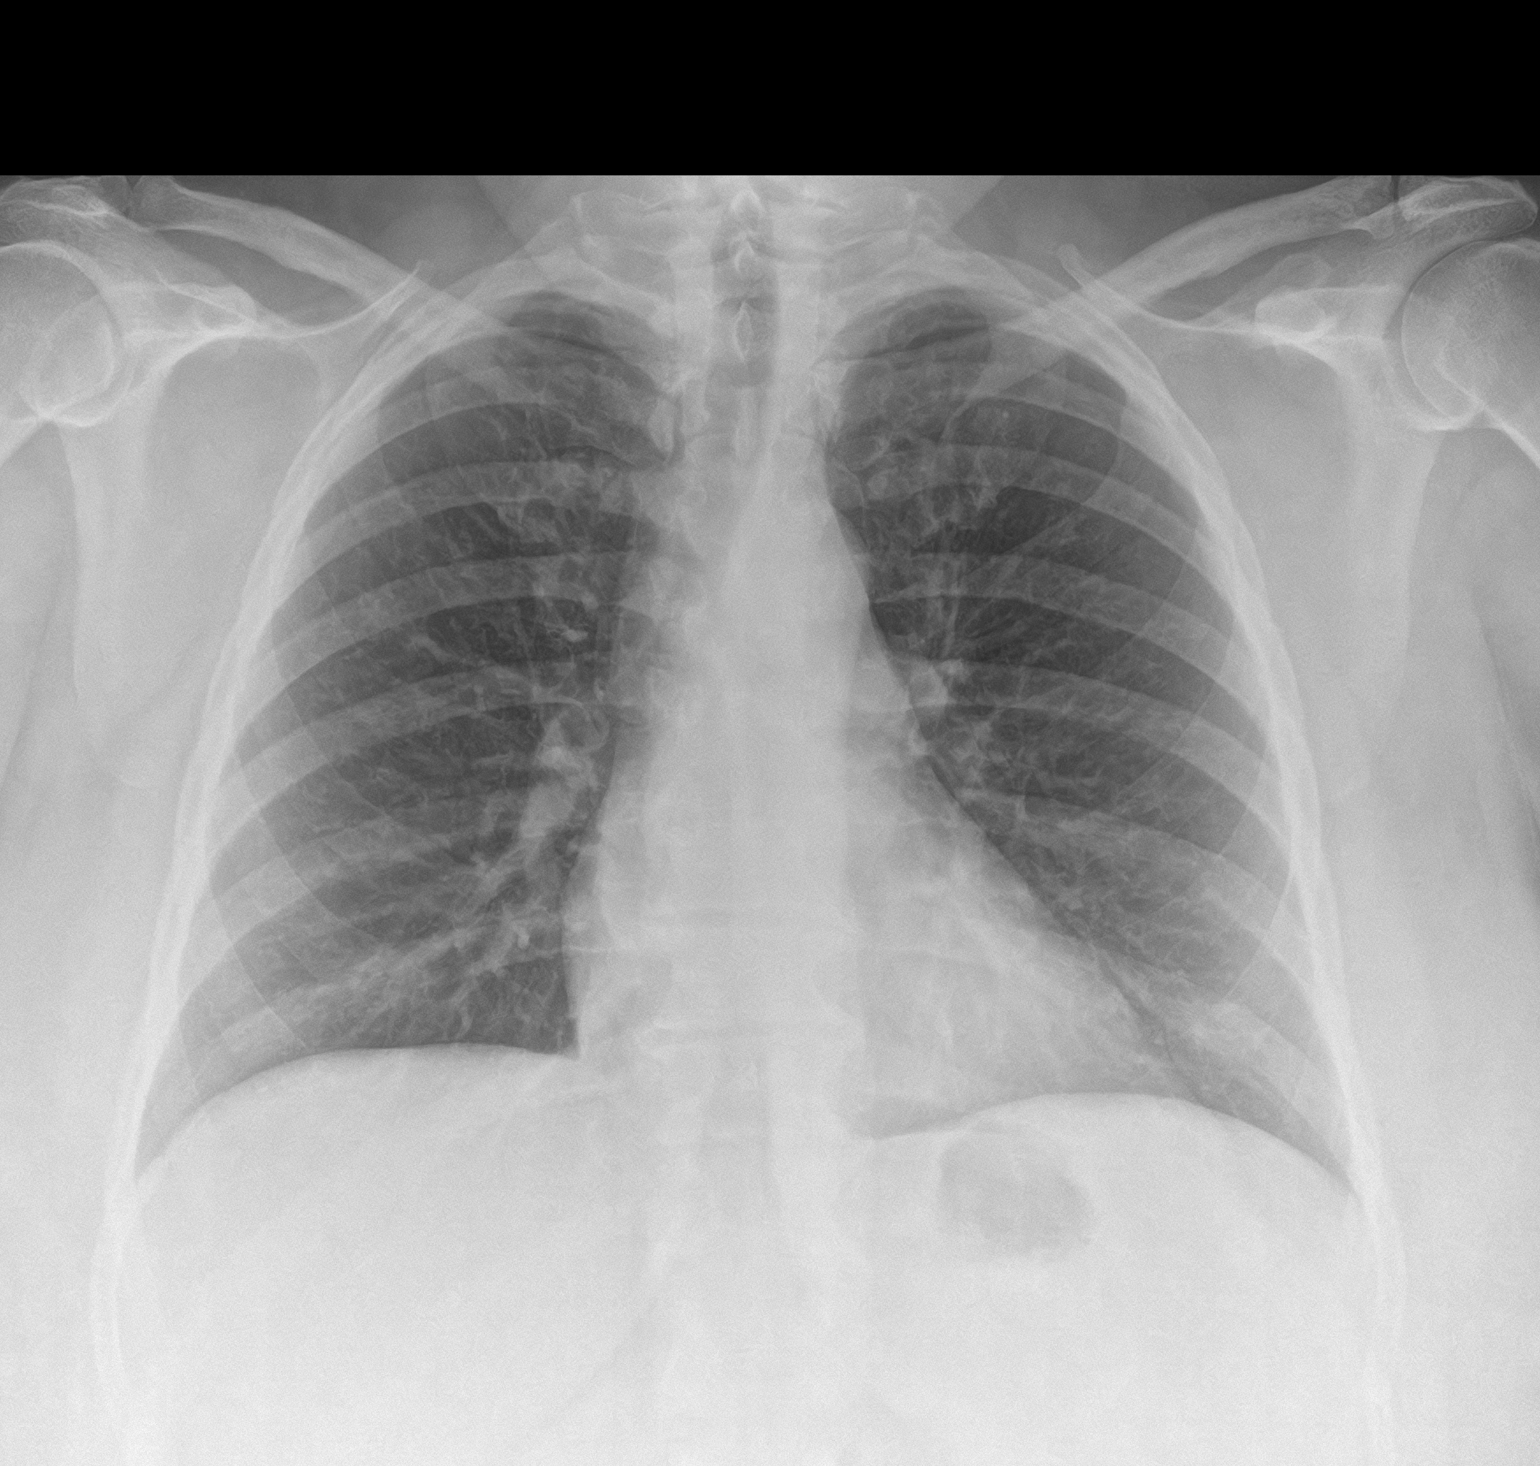
[im 2/2]
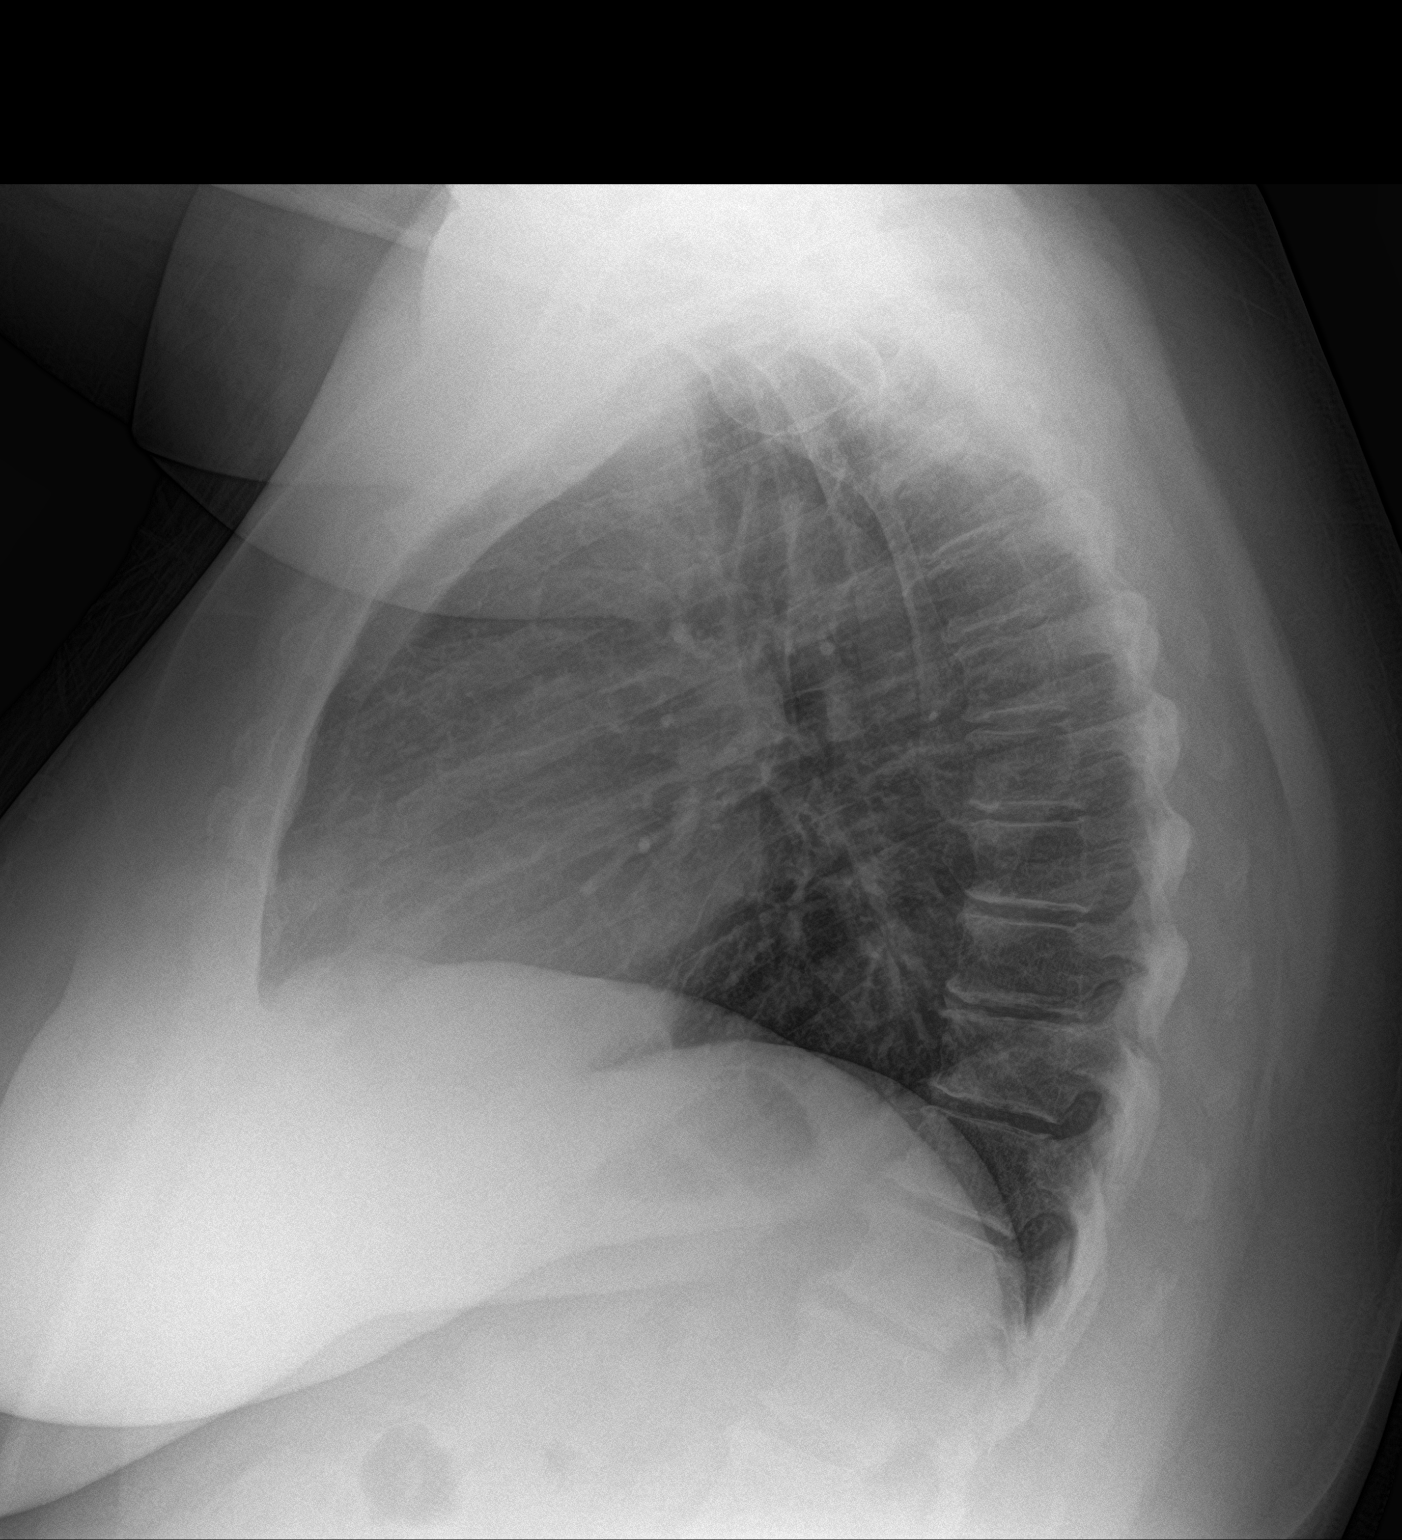

[2 of 2 positions shown; findings below may reference images not displayed]

FINDINGS: Heart size normal. There is no edema or effusion. No focal airspace
disease is present. Mild thoracic kyphosis is stable. Asymmetric
left AC joint degenerative changes have progressed.
IMPRESSION: 1. No acute cardiopulmonary disease.
2. Progressive left AC joint arthropathy.

## 2020-02-03 ENCOUNTER — Telehealth: Payer: Self-pay

## 2020-02-03 NOTE — Telephone Encounter (Signed)
Pt called that her sugar was 307 this morning and nausea and tired she ate fast food last night as per heather advised pt that drink lots of water and rest and check glucose again also need to check glucose every morning and also follow diet and call us back with reading and if not feeling better need to been seen

## 2020-03-04 ENCOUNTER — Other Ambulatory Visit: Payer: Self-pay | Admitting: Nurse Practitioner

## 2020-03-04 DIAGNOSIS — I10 Essential (primary) hypertension: Secondary | ICD-10-CM

## 2020-03-04 DIAGNOSIS — E1165 Type 2 diabetes mellitus with hyperglycemia: Secondary | ICD-10-CM

## 2020-03-04 MED ORDER — GLIMEPIRIDE 1 MG PO TABS: 1.0000 mg | ORAL_TABLET | Freq: Every day | ORAL | 3 refills | Status: DC

## 2020-03-04 MED ORDER — ATENOLOL 25 MG PO TABS: ORAL_TABLET | ORAL | 3 refills | Status: DC

## 2020-03-08 ENCOUNTER — Ambulatory Visit: Payer: BC Managed Care – PPO | Admitting: Nurse Practitioner

## 2020-04-05 ENCOUNTER — Ambulatory Visit: Payer: BC Managed Care – PPO | Admitting: Nurse Practitioner

## 2020-04-05 ENCOUNTER — Encounter: Payer: Self-pay | Admitting: Nurse Practitioner

## 2020-04-05 ENCOUNTER — Other Ambulatory Visit: Payer: Self-pay

## 2020-04-05 VITALS — BP 128/67 | HR 98 | Temp 97.7°F | Resp 16 | Ht 62.0 in | Wt 256.0 lb

## 2020-04-05 DIAGNOSIS — E782 Mixed hyperlipidemia: Secondary | ICD-10-CM

## 2020-04-05 DIAGNOSIS — E1165 Type 2 diabetes mellitus with hyperglycemia: Secondary | ICD-10-CM | POA: Diagnosis not present

## 2020-04-05 DIAGNOSIS — Z1231 Encounter for screening mammogram for malignant neoplasm of breast: Secondary | ICD-10-CM

## 2020-04-05 DIAGNOSIS — I10 Essential (primary) hypertension: Secondary | ICD-10-CM

## 2020-04-05 LAB — POCT GLYCOSYLATED HEMOGLOBIN (HGB A1C): Hemoglobin A1C: 6.9 % — AB (ref 4.0–5.6)

## 2020-04-05 NOTE — Progress Notes (Signed)
[facility] [place], [place] [address]  Internal MEDICINE  Office Visit Note  Patient Name: Rebecca Henry  [id number]  [id number]  Date of Service: 04/17/2020  Chief Complaint  Patient presents with   Follow-up   Diabetes   Hyperlipidemia    The patient presents for routine follow up visit. Her blood sugar and blood pressure are both well managed on current medications. She has improved her diet and she is participating in more physical activity. She is due to have a mammogram. She is also due to have diabetic eye exam.       Current Medication: Outpatient Encounter Medications as of 04/05/2020  Medication Sig   ALPRAZolam (XANAX) 0.25 MG tablet Take 1 tablet (0.25 mg total) by mouth at bedtime as needed for anxiety (weeknights after 6pm).   atenolol (TENORMIN) 25 MG tablet Take one tablet by mouth daily   atorvastatin (LIPITOR) 20 MG tablet Take 1 tablet (20 mg total) by mouth daily.   blood glucose meter kit and supplies KIT Dispense based on patient and insurance preference. Use up to four times daily as directed. (FOR ICD-9 250.00, 250.01).   cetirizine (ZYRTEC) 5 MG tablet Take 5 mg by mouth daily.   chlorpheniramine (CHLOR-TRIMETON) 4 MG tablet Take 4 mg by mouth daily.   cyclobenzaprine (FLEXERIL) 5 MG tablet Take 1 tablet (5 mg total) by mouth 2 (two) times daily as needed.   glimepiride (AMARYL) 1 MG tablet Take 1 tablet (1 mg total) by mouth daily with breakfast.   glucose blood (CONTOUR NEXT TEST) test strip Blood sugar testing testing three times daily  DX E11.65   ibuprofen (ADVIL,MOTRIN) 600 MG tablet Take 1 tablet (600 mg total) by mouth every 6 (six) hours as needed for moderate pain.   loratadine (CLARITIN) 10 MG tablet Take 10 mg by mouth daily as needed.    metFORMIN (GLUCOPHAGE) 500 MG tablet Take 1 tablet (500 mg total) by mouth 2 (two) times daily with a meal.   triamcinolone (KENALOG) 0.025 % cream Apply 1  application topically 2 (two) times daily.   No facility-administered encounter medications on file as of 04/05/2020.    Surgical History: Past Surgical History:  Procedure Laterality Date   CESAREAN SECTION     COLONOSCOPY WITH PROPOFOL N/A 06/02/2019   Procedure: COLONOSCOPY WITH PROPOFOL;  Surgeon: Lucilla Lame, MD;  Location: [facility] ENDOSCOPY;  Service: Endoscopy;  Laterality: N/A;    Medical History: Past Medical History:  Diagnosis Date   Allergic rhinitis    Anxiety    Diabetes mellitus without complication ([place])    Hyperlipidemia    Sleep apnea    Patient states she doesn't have it but it was in history. Did sleep study years ago    Family History: Family History  Problem Relation Age of Onset   Hypertension Mother    Diabetes Maternal Grandmother    Hypothyroidism Maternal Grandmother     Social History   Socioeconomic History   Marital status: Single    Spouse name: Not on file   Number of children: Not on file   Years of education: Not on file   Highest education level: Not on file  Occupational History   Not on file  Tobacco Use   Smoking status: Never Smoker   Smokeless tobacco: Never Used  Substance and Sexual Activity   Alcohol use: Not Currently   Drug use: No   Sexual activity: Not on file  Other Topics Concern  Not on file  Social History Narrative   Not on file   Social Determinants of Health   Financial Resource Strain:    Difficulty of Paying Living Expenses: Not on file  Food Insecurity:    Worried About [place] in the Last Year: Not on file   Ran Out of Food in the Last Year: Not on file  Transportation Needs:    Lack of Transportation (Medical): Not on file   Lack of Transportation (Non-Medical): Not on file  Physical Activity:    Days of Exercise per Week: Not on file   Minutes of Exercise per Session: Not on file  Stress:    Feeling of Stress : Not on file  Social Connections:     Frequency of Communication with Friends and Family: Not on file   Frequency of Social Gatherings with Friends and Family: Not on file   Attends Religious Services: Not on file   Active Member of Clubs or Organizations: Not on file   Attends Archivist Meetings: Not on file   Marital Status: Not on file  Intimate Partner Violence:    Fear of Current or Ex-Partner: Not on file   Emotionally Abused: Not on file   Physically Abused: Not on file   Sexually Abused: Not on file      Review of Systems  Constitutional: Negative for activity change, chills, fatigue and unexpected weight change.  HENT: Negative for congestion, postnasal drip, rhinorrhea, sneezing and sore throat.   Respiratory: Negative for cough, chest tightness, shortness of breath and wheezing.   Cardiovascular: Negative for chest pain and palpitations.  Gastrointestinal: Negative for abdominal pain, constipation, diarrhea, nausea and vomiting.  Endocrine: Negative for cold intolerance, heat intolerance, polydipsia and polyuria.       Stable blood sugars.   Musculoskeletal: Positive for back pain and myalgias. Negative for arthralgias, joint swelling and neck pain.  Skin: Negative for rash.  Allergic/Immunologic: Negative for environmental allergies.  Neurological: Negative for dizziness, tremors, numbness and headaches.  Hematological: Negative for adenopathy. Does not bruise/bleed easily.  Psychiatric/Behavioral: Negative for behavioral problems (Depression), sleep disturbance and suicidal ideas. The patient is nervous/anxious.     Today's Vitals   04/05/20 1533  BP: 128/67  Pulse: 98  Resp: 16  Temp: 97.7 F (36.5 C)  SpO2: 98%  Weight: 256 lb (116.1 kg)  Height: $Remove'5\' 2"'FvIXssD$  (1.575 m)   Body mass index is 46.82 kg/m.  Physical Exam Vitals and nursing note reviewed.  Constitutional:      General: She is not in acute distress.    Appearance: Normal appearance. She is obese.  HENT:     Head:  Normocephalic and atraumatic.     Nose: Nose normal.  Eyes:     Conjunctiva/sclera: Conjunctivae normal.     Pupils: Pupils are equal, round, and reactive to light.  Cardiovascular:     Rate and Rhythm: Normal rate and regular rhythm.     Heart sounds: Normal heart sounds.  Pulmonary:     Effort: Pulmonary effort is normal.     Breath sounds: Normal breath sounds.  Abdominal:     Palpations: Abdomen is soft.  Musculoskeletal:     Cervical back: Normal range of motion and neck supple.     Comments: Mild, intermittent lower back pain. Worse with standing and twisting at the waist.   Skin:    General: Skin is warm and dry.  Neurological:     Mental Status: She is  alert and oriented to person, place, and time.  Psychiatric:        Attention and Perception: Attention and perception normal.        Mood and Affect: Affect normal. Mood is anxious.        Speech: Speech normal.        Behavior: Behavior normal.        Thought Content: Thought content normal.        Cognition and Memory: Cognition and memory normal.        Judgment: Judgment normal.    Assessment/Plan: 1. Type 2 diabetes mellitus with hyperglycemia, without long-term current use of insulin (HCC) - POCT HgB A1C 6.9 today. Continue diabetic medication as prescribed. Refer for diabetic eye exam.  - Ambulatory referral to Ophthalmology  2. Essential hypertension Stable. Continue bp medication as prescribed   3. Mixed hyperlipidemia Continue atorvastatin as prescribed   4. Encounter for screening mammogram for malignant neoplasm of breast - MM 3D SCREEN BREAST BILATERAL; Future  General Counseling: dalani mette understanding of the findings of todays visit and agrees with plan of treatment. I have discussed any further diagnostic evaluation that may be needed or ordered today. We also reviewed her medications today. she has been encouraged to call the office with any questions or concerns that should arise related to  todays visit.  Diabetes Counseling:  1. Addition of ACE inh/ ARB'S for nephroprotection. Microalbumin is updated  2. Diabetic foot care, prevention of complications. Podiatry consult 3. Exercise and lose weight.  4. Diabetic eye examination, Diabetic eye exam is updated  5. Monitor blood sugar closlely. nutrition counseling.  6. Sign and symptoms of hypoglycemia including shaking sweating,confusion and headaches.  This patient was seen by [place] with Dr Lavera Guise as a part of collaborative care agreement  Orders Placed This Encounter  Procedures   MM 3D SCREEN BREAST BILATERAL   Ambulatory referral to Ophthalmology   POCT HgB A1C     Total time spent: 30 Minutes   Time spent includes review of chart, medications, test results, and follow up plan with the patient.      Dr Lavera Guise Internal medicine

## 2020-04-17 DIAGNOSIS — Z1231 Encounter for screening mammogram for malignant neoplasm of breast: Secondary | ICD-10-CM | POA: Insufficient documentation

## 2020-05-13 ENCOUNTER — Other Ambulatory Visit: Payer: Self-pay

## 2020-05-13 DIAGNOSIS — E1165 Type 2 diabetes mellitus with hyperglycemia: Secondary | ICD-10-CM

## 2020-05-13 MED ORDER — METFORMIN HCL 500 MG PO TABS: 500.0000 mg | ORAL_TABLET | Freq: Two times a day (BID) | ORAL | 5 refills | Status: DC

## 2020-05-16 ENCOUNTER — Encounter: Payer: BC Managed Care – PPO | Admitting: Nurse Practitioner

## 2020-07-07 ENCOUNTER — Encounter: Payer: BC Managed Care – PPO | Admitting: Nurse Practitioner

## 2020-07-12 ENCOUNTER — Other Ambulatory Visit: Payer: Self-pay

## 2020-07-12 ENCOUNTER — Ambulatory Visit
Admission: RE | Admit: 2020-07-12 | Discharge: 2020-07-12 | Disposition: A | Discharge status: Home / Self Care | Payer: BC Managed Care – PPO | Source: Ambulatory Visit | Attending: Nurse Practitioner | Admitting: Nurse Practitioner

## 2020-07-12 DIAGNOSIS — Z1231 Encounter for screening mammogram for malignant neoplasm of breast: Secondary | ICD-10-CM | POA: Insufficient documentation

## 2020-07-14 ENCOUNTER — Encounter: Payer: Self-pay | Admitting: Nurse Practitioner

## 2020-07-14 ENCOUNTER — Ambulatory Visit (INDEPENDENT_AMBULATORY_CARE_PROVIDER_SITE_OTHER): Payer: BC Managed Care – PPO | Admitting: Nurse Practitioner

## 2020-07-14 ENCOUNTER — Other Ambulatory Visit: Payer: Self-pay

## 2020-07-14 VITALS — BP 108/84 | HR 92 | Temp 97.7°F | Resp 16 | Ht 62.0 in | Wt 254.8 lb

## 2020-07-14 DIAGNOSIS — I10 Essential (primary) hypertension: Secondary | ICD-10-CM

## 2020-07-14 DIAGNOSIS — E089 Diabetes mellitus due to underlying condition without complications: Secondary | ICD-10-CM | POA: Diagnosis not present

## 2020-07-14 DIAGNOSIS — E782 Mixed hyperlipidemia: Secondary | ICD-10-CM | POA: Diagnosis not present

## 2020-07-14 DIAGNOSIS — E1165 Type 2 diabetes mellitus with hyperglycemia: Secondary | ICD-10-CM

## 2020-07-14 DIAGNOSIS — R300 Dysuria: Secondary | ICD-10-CM | POA: Diagnosis not present

## 2020-07-14 DIAGNOSIS — F411 Generalized anxiety disorder: Secondary | ICD-10-CM

## 2020-07-14 DIAGNOSIS — E119 Type 2 diabetes mellitus without complications: Secondary | ICD-10-CM | POA: Diagnosis not present

## 2020-07-14 DIAGNOSIS — H524 Presbyopia: Secondary | ICD-10-CM | POA: Diagnosis not present

## 2020-07-14 DIAGNOSIS — Z0001 Encounter for general adult medical examination with abnormal findings: Secondary | ICD-10-CM

## 2020-07-14 DIAGNOSIS — M545 Low back pain, unspecified: Secondary | ICD-10-CM

## 2020-07-14 DIAGNOSIS — F410 Panic disorder [episodic paroxysmal anxiety] without agoraphobia: Secondary | ICD-10-CM

## 2020-07-14 LAB — POCT GLYCOSYLATED HEMOGLOBIN (HGB A1C): Hemoglobin A1C: 7.4 % — AB (ref 4.0–5.6)

## 2020-07-14 MED ORDER — ATENOLOL 25 MG PO TABS: ORAL_TABLET | ORAL | 5 refills | Status: DC

## 2020-07-14 MED ORDER — ATORVASTATIN CALCIUM 20 MG PO TABS: 20.0000 mg | ORAL_TABLET | Freq: Every day | ORAL | 5 refills | Status: DC

## 2020-07-14 MED ORDER — CYCLOBENZAPRINE HCL 5 MG PO TABS: 5.0000 mg | ORAL_TABLET | Freq: Two times a day (BID) | ORAL | 2 refills | Status: DC | PRN

## 2020-07-14 MED ORDER — METFORMIN HCL 500 MG PO TABS: 500.0000 mg | ORAL_TABLET | Freq: Two times a day (BID) | ORAL | 5 refills | Status: DC

## 2020-07-14 MED ORDER — MELOXICAM 15 MG PO TABS: 15.0000 mg | ORAL_TABLET | Freq: Every day | ORAL | 3 refills | Status: DC

## 2020-07-14 MED ORDER — ALPRAZOLAM 0.25 MG PO TABS: 0.2500 mg | ORAL_TABLET | Freq: Every evening | ORAL | 2 refills | Status: DC | PRN

## 2020-07-14 MED ORDER — GLIMEPIRIDE 1 MG PO TABS: 1.0000 mg | ORAL_TABLET | Freq: Every day | ORAL | 5 refills | Status: DC

## 2020-07-14 NOTE — Progress Notes (Signed)
Negative mammogram

## 2020-07-14 NOTE — Progress Notes (Signed)
[facility] [address] [place], [place] [address]  Internal MEDICINE  Office Visit Note  Patient Name: Rebecca Henry  [id number]  [id number]  Date of Service: 08/14/2020   Pt is here for routine health maintenance examination   Chief Complaint  Patient presents with  . Annual Exam  . Diabetes  . Hyperlipidemia  . controlled substance    Reviewed with PT     The patient is here for health maintenance exam.  -blood sugars slightly elevated. HgbA1c 7.4 today.  -chronic low back pain. Will take meloxicam and flexeril as needed for pain. Needs to have refills for these medications. -blood pressure well managed.  -needs to have routine, fasting labs done -had screening mammogram 07/12/2020 and was negative -had diabetic eye exam today and this showed no diabetic retinopathy.  -had COVID 19 vaccine series.  -had flu shot at work.    Current Medication: Outpatient Encounter Medications as of 07/14/2020  Medication Sig  . blood glucose meter kit and supplies KIT Dispense based on patient and insurance preference. Use up to four times daily as directed. (FOR ICD-9 250.00, 250.01).  . cetirizine (ZYRTEC) 5 MG tablet Take 5 mg by mouth daily.  . chlorpheniramine (CHLOR-TRIMETON) 4 MG tablet Take 4 mg by mouth daily.  Marland Kitchen glucose blood (CONTOUR NEXT TEST) test strip Blood sugar testing testing three times daily  DX E11.65  . ibuprofen (ADVIL,MOTRIN) 600 MG tablet Take 1 tablet (600 mg total) by mouth every 6 (six) hours as needed for moderate pain.  Marland Kitchen loratadine (CLARITIN) 10 MG tablet Take 10 mg by mouth daily as needed.   . [DISCONTINUED] ALPRAZolam (XANAX) 0.25 MG tablet Take 1 tablet (0.25 mg total) by mouth at bedtime as needed for anxiety (weeknights after 6pm).  . [DISCONTINUED] atenolol (TENORMIN) 25 MG tablet Take one tablet by mouth daily  . [DISCONTINUED] atorvastatin (LIPITOR) 20 MG tablet Take 1 tablet (20 mg total) by mouth daily.  . [DISCONTINUED]  cyclobenzaprine (FLEXERIL) 5 MG tablet Take 1 tablet (5 mg total) by mouth 2 (two) times daily as needed.  . [DISCONTINUED] glimepiride (AMARYL) 1 MG tablet Take 1 tablet (1 mg total) by mouth daily with breakfast.  . [DISCONTINUED] metFORMIN (GLUCOPHAGE) 500 MG tablet Take 1 tablet (500 mg total) by mouth 2 (two) times daily with a meal.  . [DISCONTINUED] triamcinolone (KENALOG) 0.025 % cream Apply 1 application topically 2 (two) times daily.  Marland Kitchen ALPRAZolam (XANAX) 0.25 MG tablet Take 1 tablet (0.25 mg total) by mouth at bedtime as needed for anxiety (weeknights after 6pm).  Marland Kitchen atenolol (TENORMIN) 25 MG tablet Take one tablet by mouth daily  . atorvastatin (LIPITOR) 20 MG tablet Take 1 tablet (20 mg total) by mouth daily.  . cyclobenzaprine (FLEXERIL) 5 MG tablet Take 1 tablet (5 mg total) by mouth 2 (two) times daily as needed.  Marland Kitchen glimepiride (AMARYL) 1 MG tablet Take 1 tablet (1 mg total) by mouth daily with breakfast.  . meloxicam (MOBIC) 15 MG tablet Take 1 tablet (15 mg total) by mouth daily.  . metFORMIN (GLUCOPHAGE) 500 MG tablet Take 1 tablet (500 mg total) by mouth 2 (two) times daily with a meal.   No facility-administered encounter medications on file as of 07/14/2020.    Surgical History: Past Surgical History:  Procedure Laterality Date  . CESAREAN SECTION    . COLONOSCOPY WITH PROPOFOL N/A 06/02/2019   Procedure: COLONOSCOPY WITH PROPOFOL;  Surgeon: Midge Minium, MD;  Location: [facility] ENDOSCOPY;  Service:  Endoscopy;  Laterality: N/A;    Medical History: Past Medical History:  Diagnosis Date  . Allergic rhinitis   . Anxiety   . Diabetes mellitus without complication ([place])   . Hyperlipidemia   . Sleep apnea    Patient states she doesn't have it but it was in history. Did sleep study years ago    Family History: Family History  Problem Relation Age of Onset  . Hypertension Mother   . Diabetes Maternal Grandmother   . Hypothyroidism Maternal Grandmother   . Breast cancer  Neg Hx       Review of Systems  Constitutional: Negative for activity change, chills, fatigue and unexpected weight change.  HENT: Negative for congestion, postnasal drip, rhinorrhea, sneezing and sore throat.   Eyes:       Had diabetic eye exam today showing no diabetic retinopathy.   Respiratory: Negative for cough, chest tightness, shortness of breath and wheezing.   Cardiovascular: Negative for chest pain and palpitations.  Gastrointestinal: Negative for abdominal pain, constipation, diarrhea, nausea and vomiting.  Endocrine: Negative for cold intolerance, heat intolerance, polydipsia and polyuria.       Mildly elevated blood sugars over past few months.   Genitourinary: Negative for dysuria, frequency and urgency.  Musculoskeletal: Positive for back pain and myalgias. Negative for arthralgias, joint swelling and neck pain.       Chronic lower back pain.  Skin: Negative for rash.  Allergic/Immunologic: Negative for environmental allergies.  Neurological: Negative for dizziness, tremors, numbness and headaches.  Hematological: Negative for adenopathy. Does not bruise/bleed easily.  Psychiatric/Behavioral: Negative for behavioral problems (Depression), sleep disturbance and suicidal ideas. The patient is nervous/anxious.      Today's Vitals   07/14/20 1535  BP: 108/84  Pulse: 92  Resp: 16  Temp: 97.7 F (36.5 C)  SpO2: 98%  Weight: 254 lb 12.8 oz (115.6 kg)  Height: $Remove'5\' 2"'nnuHhTb$  (1.575 m)   Body mass index is 46.6 kg/m.  Physical Exam Vitals and nursing note reviewed.  Constitutional:      General: She is not in acute distress.    Appearance: Normal appearance. She is well-developed and well-nourished. She is obese. She is not diaphoretic.  HENT:     Head: Normocephalic and atraumatic.     Nose: Nose normal.     Mouth/Throat:     Mouth: Oropharynx is clear and moist.     Pharynx: No oropharyngeal exudate.  Eyes:     Extraocular Movements: EOM normal.     Pupils: Pupils  are equal, round, and reactive to light.  Neck:     Thyroid: No thyromegaly.     Vascular: No carotid bruit or JVD.     Trachea: No tracheal deviation.  Cardiovascular:     Rate and Rhythm: Normal rate and regular rhythm.     Pulses: Normal pulses.     Heart sounds: Normal heart sounds. No murmur heard. No friction rub. No gallop.   Pulmonary:     Effort: Pulmonary effort is normal. No respiratory distress.     Breath sounds: Normal breath sounds. No wheezing or rales.  Chest:     Chest wall: No tenderness.  Abdominal:     General: Bowel sounds are normal.     Palpations: Abdomen is soft.     Tenderness: There is no abdominal tenderness.  Musculoskeletal:        General: Normal range of motion.     Cervical back: Normal range of motion and neck supple.  Comments: Mild lower back pain. Worse with bending or twisting at the waist. No visible or palpable abnormalities or deformities noted.   Lymphadenopathy:     Cervical: No cervical adenopathy.  Skin:    General: Skin is warm and dry.     Capillary Refill: Capillary refill takes less than 2 seconds.  Neurological:     General: No focal deficit present.     Mental Status: She is alert and oriented to person, place, and time.     Cranial Nerves: No cranial nerve deficit.  Psychiatric:        Mood and Affect: Mood and affect and mood normal.        Behavior: Behavior normal.        Thought Content: Thought content normal.        Judgment: Judgment normal.      LABS: Recent Results (from the past 2160 hour(s))  UA/M w/rflx Culture, Routine     Status: None   Collection Time: 07/14/20  3:42 PM   Specimen: Urine   Urine  Result Value Ref Range   Specific Gravity, UA 1.020 1.005 - 1.030   pH, UA 5.5 5.0 - 7.5   Color, UA Yellow Yellow   Appearance Ur Clear Clear   Leukocytes,UA Negative Negative   Protein,UA Negative Negative/Trace   Glucose, UA Negative Negative   Ketones, UA Negative Negative   RBC, UA Negative  Negative   Bilirubin, UA Negative Negative   Urobilinogen, Ur 0.2 0.2 - 1.0 mg/dL   Nitrite, UA Negative Negative   Microscopic Examination Comment     Comment: Microscopic follows if indicated.   Microscopic Examination See below:     Comment: Microscopic was indicated and was performed.   Urinalysis Reflex Comment     Comment: This specimen will not reflex to a Urine Culture.  Microscopic Examination     Status: Abnormal   Collection Time: 07/14/20  3:42 PM   Urine  Result Value Ref Range   WBC, UA None seen 0 - 5 /hpf   RBC 0-2 0 - 2 /hpf   Epithelial Cells (non renal) 0-10 0 - 10 /hpf   Casts None seen None seen /lpf   Crystals Present (A) N/A   Crystal Type Calcium Oxalate N/A   Bacteria, UA None seen None seen/Few  POCT HgB A1C     Status: Abnormal   Collection Time: 07/14/20  3:56 PM  Result Value Ref Range   Hemoglobin A1C 7.4 (A) 4.0 - 5.6 %   HbA1c POC (<> result, manual entry)     HbA1c, POC (prediabetic range)     HbA1c, POC (controlled diabetic range)      Assessment/Plan: 1. Encounter for general adult medical examination with abnormal findings Annual health maintenance exam today. Order slip given to check routine, fasting labs drawn.   2. Type 2 diabetes mellitus with hyperglycemia, without long-term current use of insulin (HCC) - POCT HgB A1C 7.4 today. No medication changes made at this time. Reviewed importance of following diabetic, heart healthy diet and incorporating exercise into daily routine. Monitor blood sugars closely.  - glimepiride (AMARYL) 1 MG tablet; Take 1 tablet (1 mg total) by mouth daily with breakfast.  Dispense: 30 tablet; Refill: 5 - metFORMIN (GLUCOPHAGE) 500 MG tablet; Take 1 tablet (500 mg total) by mouth 2 (two) times daily with a meal.  Dispense: 60 tablet; Refill: 5  3. Essential hypertension Stable. Continue atenolol daily.  - atenolol (TENORMIN) 25 MG tablet;  Take one tablet by mouth daily  Dispense: 30 tablet; Refill: 5  4.  Mixed hyperlipidemia Check fasting lipid panel and adjust atorvastatin dosing as indicated.  - atorvastatin (LIPITOR) 20 MG tablet; Take 1 tablet (20 mg total) by mouth daily.  Dispense: 30 tablet; Refill: 5  5. Low back pain at multiple sites Intermittent in nature. May take meloxicam $RemoveBefore'15mg'KCRkkivTopDXW$  once daily if needed to improve pain and inflammation. May use flexeril up to twice daily as needed for tight muscles.  - meloxicam (MOBIC) 15 MG tablet; Take 1 tablet (15 mg total) by mouth daily.  Dispense: 30 tablet; Refill: 3 - cyclobenzaprine (FLEXERIL) 5 MG tablet; Take 1 tablet (5 mg total) by mouth 2 (two) times daily as needed.  Dispense: 45 tablet; Refill: 2  6. Generalized anxiety disorder with panic attacks May take alprazolam 0.$RemoveBeforeDEI'25mg'uwLJnjelehjWviPJ$  at bedtime as needed for acute anxiety/insomnia.  - ALPRAZolam (XANAX) 0.25 MG tablet; Take 1 tablet (0.25 mg total) by mouth at bedtime as needed for anxiety (weeknights after 6pm).  Dispense: 30 tablet; Refill: 2  7. Dysuria - UA/M w/rflx Culture, Routine  General Counseling: genowefa morga understanding of the findings of todays visit and agrees with plan of treatment. I have discussed any further diagnostic evaluation that may be needed or ordered today. We also reviewed her medications today. she has been encouraged to call the office with any questions or concerns that should arise related to todays visit.    Counseling:  Diabetes Counseling:  1. Addition of ACE inh/ ARB'S for nephroprotection. Microalbumin is updated  2. Diabetic foot care, prevention of complications. Podiatry consult 3. Exercise and lose weight.  4. Diabetic eye examination, Diabetic eye exam is updated  5. Monitor blood sugar closlely. nutrition counseling.  6. Sign and symptoms of hypoglycemia including shaking sweating,confusion and headaches.   This patient was seen by Leretha Pol FNP Collaboration with Dr Lavera Guise as a part of collaborative care agreement  Orders  Placed This Encounter  Procedures  . Microscopic Examination  . UA/M w/rflx Culture, Routine  . POCT HgB A1C    Meds ordered this encounter  Medications  . meloxicam (MOBIC) 15 MG tablet    Sig: Take 1 tablet (15 mg total) by mouth daily.    Dispense:  30 tablet    Refill:  3    Order Specific Question:   Supervising Provider    Answer:   Lavera Guise [[id number]]  . ALPRAZolam (XANAX) 0.25 MG tablet    Sig: Take 1 tablet (0.25 mg total) by mouth at bedtime as needed for anxiety (weeknights after 6pm).    Dispense:  30 tablet    Refill:  2    Order Specific Question:   Supervising Provider    Answer:   Lavera Guise [[id number]]  . atenolol (TENORMIN) 25 MG tablet    Sig: Take one tablet by mouth daily    Dispense:  30 tablet    Refill:  5    Order Specific Question:   Supervising Provider    Answer:   Lavera Guise [[id number]]  . atorvastatin (LIPITOR) 20 MG tablet    Sig: Take 1 tablet (20 mg total) by mouth daily.    Dispense:  30 tablet    Refill:  5    Order Specific Question:   Supervising Provider    Answer:   Lavera Guise [[id number]]  . cyclobenzaprine (FLEXERIL) 5 MG tablet    Sig: Take 1 tablet (5 mg  total) by mouth 2 (two) times daily as needed.    Dispense:  45 tablet    Refill:  2    Order Specific Question:   Supervising Provider    Answer:   Lavera Guise [[id number]]  . glimepiride (AMARYL) 1 MG tablet    Sig: Take 1 tablet (1 mg total) by mouth daily with breakfast.    Dispense:  30 tablet    Refill:  5    Order Specific Question:   Supervising Provider    Answer:   Lavera Guise [place]  . metFORMIN (GLUCOPHAGE) 500 MG tablet    Sig: Take 1 tablet (500 mg total) by mouth 2 (two) times daily with a meal.    Dispense:  60 tablet    Refill:  5    Order Specific Question:   Supervising Provider    Answer:   Lavera Guise [[id number]]    Total time spent: 104 Minutes  Time spent includes review of chart, medications, test results, and follow up plan with the patient.     Lavera Guise, MD  Internal Medicine

## 2020-07-15 LAB — UA/M W/RFLX CULTURE, ROUTINE
Bilirubin, UA: NEGATIVE
Glucose, UA: NEGATIVE
Ketones, UA: NEGATIVE
Leukocytes,UA: NEGATIVE
Nitrite, UA: NEGATIVE
Protein,UA: NEGATIVE
RBC, UA: NEGATIVE
Specific Gravity, UA: 1.02 (ref 1.005–1.030)
Urobilinogen, Ur: 0.2 mg/dL (ref 0.2–1.0)
pH, UA: 5.5 (ref 5.0–7.5)

## 2020-07-15 LAB — MICROSCOPIC EXAMINATION
Bacteria, UA: NONE SEEN
Casts: NONE SEEN /lpf
WBC, UA: NONE SEEN /hpf (ref 0–5)

## 2020-08-14 DIAGNOSIS — Z0001 Encounter for general adult medical examination with abnormal findings: Secondary | ICD-10-CM | POA: Insufficient documentation

## 2020-10-12 ENCOUNTER — Ambulatory Visit: Payer: BC Managed Care – PPO | Admitting: Hospice and Palliative Medicine

## 2021-01-16 ENCOUNTER — Other Ambulatory Visit: Payer: Self-pay

## 2021-01-16 DIAGNOSIS — E782 Mixed hyperlipidemia: Secondary | ICD-10-CM

## 2021-01-16 DIAGNOSIS — E1165 Type 2 diabetes mellitus with hyperglycemia: Secondary | ICD-10-CM

## 2021-01-16 DIAGNOSIS — I10 Essential (primary) hypertension: Secondary | ICD-10-CM

## 2021-01-16 MED ORDER — ATORVASTATIN CALCIUM 20 MG PO TABS: 20.0000 mg | ORAL_TABLET | Freq: Every day | ORAL | 0 refills | Status: DC

## 2021-01-16 MED ORDER — ATENOLOL 25 MG PO TABS: ORAL_TABLET | ORAL | 0 refills | Status: DC

## 2021-01-16 MED ORDER — METFORMIN HCL 500 MG PO TABS: 500.0000 mg | ORAL_TABLET | Freq: Two times a day (BID) | ORAL | 0 refills | Status: DC

## 2021-02-17 ENCOUNTER — Other Ambulatory Visit: Payer: Self-pay

## 2021-02-17 ENCOUNTER — Ambulatory Visit (INDEPENDENT_AMBULATORY_CARE_PROVIDER_SITE_OTHER): Payer: BC Managed Care – PPO | Admitting: Physician Assistant

## 2021-02-17 ENCOUNTER — Encounter: Payer: Self-pay | Admitting: Physician Assistant

## 2021-02-17 DIAGNOSIS — E782 Mixed hyperlipidemia: Secondary | ICD-10-CM

## 2021-02-17 DIAGNOSIS — R5383 Other fatigue: Secondary | ICD-10-CM

## 2021-02-17 DIAGNOSIS — F411 Generalized anxiety disorder: Secondary | ICD-10-CM

## 2021-02-17 DIAGNOSIS — Z79899 Other long term (current) drug therapy: Secondary | ICD-10-CM

## 2021-02-17 DIAGNOSIS — Z6841 Body Mass Index (BMI) 40.0 and over, adult: Secondary | ICD-10-CM | POA: Diagnosis not present

## 2021-02-17 DIAGNOSIS — I10 Essential (primary) hypertension: Secondary | ICD-10-CM | POA: Diagnosis not present

## 2021-02-17 DIAGNOSIS — F410 Panic disorder [episodic paroxysmal anxiety] without agoraphobia: Secondary | ICD-10-CM

## 2021-02-17 DIAGNOSIS — E1165 Type 2 diabetes mellitus with hyperglycemia: Secondary | ICD-10-CM | POA: Diagnosis not present

## 2021-02-17 LAB — POCT GLYCOSYLATED HEMOGLOBIN (HGB A1C): Hemoglobin A1C: 8.3 % — AB (ref 4.0–5.6)

## 2021-02-17 LAB — POCT URINE DRUG SCREEN
Methylenedioxyamphetamine: NOT DETECTED
POC Amphetamine UR: NOT DETECTED
POC BENZODIAZEPINES UR: NOT DETECTED
POC Barbiturate UR: NOT DETECTED
POC Cocaine UR: NOT DETECTED
POC Ecstasy UR: NOT DETECTED
POC Marijuana UR: NOT DETECTED
POC Methadone UR: NOT DETECTED
POC Methamphetamine UR: NOT DETECTED
POC Opiate Ur: NOT DETECTED
POC Oxycodone UR: NOT DETECTED
POC PHENCYCLIDINE UR: NOT DETECTED
POC TRICYCLICS UR: NOT DETECTED

## 2021-02-17 MED ORDER — DAPAGLIFLOZIN PROPANEDIOL 10 MG PO TABS
10.0000 mg | ORAL_TABLET | Freq: Every day | ORAL | 2 refills | Status: DC
Start: 2021-02-17 — End: 2021-04-07

## 2021-02-17 MED ORDER — ATENOLOL 25 MG PO TABS: ORAL_TABLET | ORAL | 0 refills | Status: DC

## 2021-02-17 MED ORDER — ATORVASTATIN CALCIUM 20 MG PO TABS: 20.0000 mg | ORAL_TABLET | Freq: Every day | ORAL | 0 refills | Status: DC

## 2021-02-17 MED ORDER — ALPRAZOLAM 0.25 MG PO TABS: 0.2500 mg | ORAL_TABLET | Freq: Every evening | ORAL | 0 refills | Status: DC | PRN

## 2021-02-17 NOTE — Progress Notes (Signed)
[facility] [place], [place] [address]  Internal MEDICINE  Office Visit Note  Patient Name: Rebecca Henry  [id number]  [id number]  Date of Service: 02/19/2021  Chief Complaint  Patient presents with   Follow-up    Discuss meds, left soreness in elbow, discuss getting blood work    Designer, industrial/product Gaps    Will get booster today, discuss shingrix     HPI Pt is here for routine follow up -Stopped metformin a few months ago because of GI upset, not regularly checking sugar until after she stopped and is around 200s. Stopped glimepriride in the last 2 weeks as well. Was trying to determine if both meds caused S/e and is still unsure. -Will need to start on alternative medication for better control without GI S/E -Pt is due for routine lab work -[employer] as needed, not daily and only takes a half tab when needed -Needs refill of atenolol and atorvastatin as well  Current Medication: Outpatient Encounter Medications as of 02/17/2021  Medication Sig   acetaminophen (TYLENOL) 500 MG tablet Take 500 mg by mouth every 6 (six) hours as needed.   blood glucose meter kit and supplies KIT Dispense based on patient and insurance preference. Use up to four times daily as directed. (FOR ICD-9 250.00, 250.01).   cetirizine (ZYRTEC) 5 MG tablet Take 5 mg by mouth daily.   chlorpheniramine (CHLOR-TRIMETON) 4 MG tablet Take 4 mg by mouth daily.   cyclobenzaprine (FLEXERIL) 5 MG tablet Take 1 tablet (5 mg total) by mouth 2 (two) times daily as needed.   dapagliflozin propanediol (FARXIGA) 10 MG TABS tablet Take 1 tablet (10 mg total) by mouth daily before breakfast.   glucose blood (CONTOUR NEXT TEST) test strip Blood sugar testing testing three times daily  DX E11.65   ibuprofen (ADVIL,MOTRIN) 600 MG tablet Take 1 tablet (600 mg total) by mouth every 6 (six) hours as needed for moderate pain.   loratadine (CLARITIN) 10 MG tablet Take 10 mg by mouth daily as needed.     meloxicam (MOBIC) 15 MG tablet Take 1 tablet (15 mg total) by mouth daily.   [DISCONTINUED] ALPRAZolam (XANAX) 0.25 MG tablet Take 1 tablet (0.25 mg total) by mouth at bedtime as needed for anxiety (weeknights after 6pm).   [DISCONTINUED] atenolol (TENORMIN) 25 MG tablet Take one tablet by mouth daily   [DISCONTINUED] atorvastatin (LIPITOR) 20 MG tablet Take 1 tablet (20 mg total) by mouth daily.   ALPRAZolam (XANAX) 0.25 MG tablet Take 1 tablet (0.25 mg total) by mouth at bedtime as needed for anxiety (weeknights after 6pm).   atenolol (TENORMIN) 25 MG tablet Take one tablet by mouth daily   atorvastatin (LIPITOR) 20 MG tablet Take 1 tablet (20 mg total) by mouth daily.   [DISCONTINUED] glimepiride (AMARYL) 1 MG tablet Take 1 tablet (1 mg total) by mouth daily with breakfast. (Patient not taking: Reported on 02/17/2021)   [DISCONTINUED] metFORMIN (GLUCOPHAGE) 500 MG tablet Take 1 tablet (500 mg total) by mouth 2 (two) times daily with a meal. (Patient not taking: Reported on 02/17/2021)   No facility-administered encounter medications on file as of 02/17/2021.    Surgical History: Past Surgical History:  Procedure Laterality Date   CESAREAN SECTION     COLONOSCOPY WITH PROPOFOL N/A 06/02/2019   Procedure: COLONOSCOPY WITH PROPOFOL;  Surgeon: Lucilla Lame, MD;  Location: [facility] ENDOSCOPY;  Service: Endoscopy;  Laterality: N/A;    Medical History: Past Medical History:  Diagnosis Date  Allergic rhinitis    Anxiety    Diabetes mellitus without complication ([place])    Hyperlipidemia    Sleep apnea    Patient states she doesn't have it but it was in history. Did sleep study years ago    Family History: Family History  Problem Relation Age of Onset   Hypertension Mother    Diabetes Maternal Grandmother    Hypothyroidism Maternal Grandmother    Breast cancer Neg Hx     Social History   Socioeconomic History   Marital status: Single    Spouse name: Not on file   Number of children:  Not on file   Years of education: Not on file   Highest education level: Not on file  Occupational History   Not on file  Tobacco Use   Smoking status: Never   Smokeless tobacco: Never  Substance and Sexual Activity   Alcohol use: Not Currently   Drug use: No   Sexual activity: Not on file  Other Topics Concern   Not on file  Social History Narrative   Not on file   Social Determinants of Health   Financial Resource Strain: Not on file  Food Insecurity: Not on file  Transportation Needs: Not on file  Physical Activity: Not on file  Stress: Not on file  Social Connections: Not on file  Intimate Partner Violence: Not on file      Review of Systems  Constitutional:  Negative for chills, fatigue and unexpected weight change.  HENT:  Negative for congestion, postnasal drip, rhinorrhea, sneezing and sore throat.   Eyes:  Negative for redness.  Respiratory:  Negative for cough, chest tightness and shortness of breath.   Cardiovascular:  Negative for chest pain and palpitations.  Gastrointestinal:  Negative for abdominal pain, constipation, diarrhea, nausea and vomiting.  Genitourinary:  Negative for dysuria and frequency.  Musculoskeletal:  Negative for arthralgias, back pain, joint swelling and neck pain.  Skin:  Negative for rash.  Neurological: Negative.  Negative for tremors and numbness.  Hematological:  Negative for adenopathy. Does not bruise/bleed easily.  Psychiatric/Behavioral:  Negative for behavioral problems (Depression), sleep disturbance and suicidal ideas. The patient is not nervous/anxious.    Vital Signs: BP 130/80 Comment: 140/94  Pulse 86   Temp (!) 97.3 F (36.3 C)   Resp 16   Ht $R'5\' 2"'VU$  (1.575 m)   Wt 247 lb 9.6 oz (112.3 kg)   SpO2 98%   BMI 45.29 kg/m    Physical Exam Vitals and nursing note reviewed.  Constitutional:      General: She is not in acute distress.    Appearance: She is well-developed. She is obese. She is not diaphoretic.   HENT:     Head: Normocephalic and atraumatic.     Mouth/Throat:     Pharynx: No oropharyngeal exudate.  Eyes:     Pupils: Pupils are equal, round, and reactive to light.  Neck:     Thyroid: No thyromegaly.     Vascular: No JVD.     Trachea: No tracheal deviation.  Cardiovascular:     Rate and Rhythm: Normal rate and regular rhythm.     Heart sounds: Normal heart sounds. No murmur heard.   No friction rub. No gallop.  Pulmonary:     Effort: Pulmonary effort is normal. No respiratory distress.     Breath sounds: No wheezing or rales.  Chest:     Chest wall: No tenderness.  Abdominal:     General: Bowel  sounds are normal.     Palpations: Abdomen is soft.  Musculoskeletal:        General: Normal range of motion.     Cervical back: Normal range of motion and neck supple.  Lymphadenopathy:     Cervical: No cervical adenopathy.  Skin:    General: Skin is warm and dry.  Neurological:     Mental Status: She is alert and oriented to person, place, and time.     Cranial Nerves: No cranial nerve deficit.  Psychiatric:        Behavior: Behavior normal.        Thought Content: Thought content normal.        Judgment: Judgment normal.       Assessment/Plan: 1. Type 2 diabetes mellitus with hyperglycemia, without long-term current use of insulin (HCC) - POCT HgB A1C is 8.3, which is up from last visit, but pt had stopped her medications for awhile due to S/E. Will stop metformin and glimepiride and start farxiga for BG and wt loss. Will start with 1/2 tab daily for 2 weeks then may increase to full tab. Monitor BG closely. - dapagliflozin propanediol (FARXIGA) 10 MG TABS tablet; Take 1 tablet (10 mg total) by mouth daily before breakfast.  Dispense: 30 tablet; Refill: 2  2. Essential hypertension Stable, continue current medication - atenolol (TENORMIN) 25 MG tablet; Take one tablet by mouth daily  Dispense: 30 tablet; Refill: 0  3. Mixed hyperlipidemia Continue lipitor -  atorvastatin (LIPITOR) 20 MG tablet; Take 1 tablet (20 mg total) by mouth daily.  Dispense: 30 tablet; Refill: 0  4. Generalized anxiety disorder with panic attacks May continue 1/2 tab xanax as needed - ALPRAZolam (XANAX) 0.25 MG tablet; Take 1 tablet (0.25 mg total) by mouth at bedtime as needed for anxiety (weeknights after 6pm).  Dispense: 30 tablet; Refill: 0  5. Other fatigue - CBC w/Diff/Platelet - Comprehensive metabolic panel - TSH + free T4 - Lipid Panel With LDL/HDL Ratio  6. Encounter for long-term (current) use of high-risk medication - POCT Urine Drug Screen  7. Morbid obesity with BMI of 40.0-44.9, adult ([place]) Will start on Farxiga for BG control and wt loss Obesity Counseling: Had a lengthy discussion regarding patients BMI and weight issues. Patient was instructed on portion control as well as increased activity. Also discussed caloric restrictions with trying to maintain intake less than 2000 Kcal. Discussions were made in accordance with the 5As of weight management. Simple actions such as not eating late and if able to, taking a walk is suggested.    General Counseling: christin moline understanding of the findings of todays visit and agrees with plan of treatment. I have discussed any further diagnostic evaluation that may be needed or ordered today. We also reviewed her medications today. she has been encouraged to call the office with any questions or concerns that should arise related to todays visit.    Orders Placed This Encounter  Procedures   CBC w/Diff/Platelet   Comprehensive metabolic panel   TSH + free T4   Lipid Panel With LDL/HDL Ratio   POCT HgB A1C   POCT Urine Drug Screen     Meds ordered this encounter  Medications   dapagliflozin propanediol (FARXIGA) 10 MG TABS tablet    Sig: Take 1 tablet (10 mg total) by mouth daily before breakfast.    Dispense:  30 tablet    Refill:  2   atenolol (TENORMIN) 25 MG tablet    Sig: Take  one tablet by  mouth daily    Dispense:  30 tablet    Refill:  0   atorvastatin (LIPITOR) 20 MG tablet    Sig: Take 1 tablet (20 mg total) by mouth daily.    Dispense:  30 tablet    Refill:  0   ALPRAZolam (XANAX) 0.25 MG tablet    Sig: Take 1 tablet (0.25 mg total) by mouth at bedtime as needed for anxiety (weeknights after 6pm).    Dispense:  30 tablet    Refill:  0     This patient was seen by Drema Dallas, PA-C in collaboration with Dr. Clayborn Bigness as a part of collaborative care agreement.   Total time spent:40 Minutes Time spent includes review of chart, medications, test results, and follow up plan with the patient.      Dr Lavera Guise Internal medicine

## 2021-02-18 LAB — CBC WITH DIFFERENTIAL/PLATELET
Basophils Absolute: 0.1 10*3/uL (ref 0.0–0.2)
Basos: 1 %
EOS (ABSOLUTE): 0.1 10*3/uL (ref 0.0–0.4)
Eos: 2 %
Hematocrit: 40.6 % (ref 34.0–46.6)
Hemoglobin: 14.1 g/dL (ref 11.1–15.9)
Immature Grans (Abs): 0 10*3/uL (ref 0.0–0.1)
Immature Granulocytes: 0 %
Lymphocytes Absolute: 1.9 10*3/uL (ref 0.7–3.1)
Lymphs: 31 %
MCH: 31.6 pg (ref 26.6–33.0)
MCHC: 34.7 g/dL (ref 31.5–35.7)
MCV: 91 fL (ref 79–97)
Monocytes Absolute: 0.3 10*3/uL (ref 0.1–0.9)
Monocytes: 5 %
Neutrophils Absolute: 3.8 10*3/uL (ref 1.4–7.0)
Neutrophils: 61 %
Platelets: 207 10*3/uL (ref 150–450)
RBC: 4.46 x10E6/uL (ref 3.77–5.28)
RDW: 11.9 % (ref 11.7–15.4)
WBC: 6.2 10*3/uL (ref 3.4–10.8)

## 2021-02-18 LAB — COMPREHENSIVE METABOLIC PANEL
ALT: 37 IU/L — ABNORMAL HIGH (ref 0–32)
AST: 25 IU/L (ref 0–40)
Albumin/Globulin Ratio: 1.7 (ref 1.2–2.2)
Albumin: 4.5 g/dL (ref 3.8–4.9)
Alkaline Phosphatase: 128 IU/L — ABNORMAL HIGH (ref 44–121)
BUN/Creatinine Ratio: 14 (ref 9–23)
BUN: 10 mg/dL (ref 6–24)
Bilirubin Total: 0.4 mg/dL (ref 0.0–1.2)
CO2: 20 mmol/L (ref 20–29)
Calcium: 9.4 mg/dL (ref 8.7–10.2)
Chloride: 98 mmol/L (ref 96–106)
Creatinine, Ser: 0.71 mg/dL (ref 0.57–1.00)
Globulin, Total: 2.7 g/dL (ref 1.5–4.5)
Glucose: 230 mg/dL — ABNORMAL HIGH (ref 65–99)
Potassium: 4.3 mmol/L (ref 3.5–5.2)
Sodium: 135 mmol/L (ref 134–144)
Total Protein: 7.2 g/dL (ref 6.0–8.5)
eGFR: 102 mL/min/{1.73_m2} (ref >59)

## 2021-02-18 LAB — LIPID PANEL WITH LDL/HDL RATIO
Cholesterol, Total: 134 mg/dL (ref 100–199)
HDL: 40 mg/dL (ref >39)
LDL Chol Calc (NIH): 70 mg/dL (ref 0–99)
LDL/HDL Ratio: 1.8 ratio (ref 0.0–3.2)
Triglycerides: 138 mg/dL (ref 0–149)
VLDL Cholesterol Cal: 24 mg/dL (ref 5–40)

## 2021-02-18 LAB — TSH+FREE T4
Free T4: 1.07 ng/dL (ref 0.82–1.77)
TSH: 1.4 u[IU]/mL (ref 0.450–4.500)

## 2021-02-19 NOTE — Patient Instructions (Signed)
Obesity, Adult Obesity is having too much body fat. Being obese means that your weight is morethan what is healthy for you. BMI is a number that explains how much body fat you have. If you have a BMI of 30 or more, you are obese. Obesity is often caused by eating or drinking morecalories than your body uses. Changing your lifestyle can help you lose weight. Obesity can cause serious health problems, such as: Stroke. Coronary artery disease (CAD). Type 2 diabetes. Some types of cancer, including cancers of the colon, breast, uterus, and gallbladder. Osteoarthritis. High blood pressure (hypertension). High cholesterol. Sleep apnea. Gallbladder stones. Infertility problems. What are the causes? Eating meals each day that are high in calories, sugar, and fat. Being born with genes that may make you more likely to become obese. Having a medical condition that causes obesity. Taking certain medicines. Sitting a lot (having a sedentary lifestyle). Not getting enough sleep. Drinking a lot of drinks that have sugar in them. What increases the risk? Having a family history of obesity. Being an African American woman. Being a Hispanic man. Living in an area with limited access to: Parks, recreation centers, or sidewalks. Healthy food choices, such as grocery stores and farmers' markets. What are the signs or symptoms? The main sign is having too much body fat. How is this treated? Treatment for this condition often includes changing your lifestyle. Treatment may include: Changing your diet. This may include making a healthy meal plan. Exercise. This may include activity that causes your heart to beat faster (aerobic exercise) and strength training. Work with your doctor to design a program that works for you. Medicine to help you lose weight. This may be used if you are not able to lose 1 pound a week after 6 weeks of healthy eating and more exercise. Treating conditions that cause the  obesity. Surgery. Options may include gastric banding and gastric bypass. This may be done if: Other treatments have not helped to improve your condition. You have a BMI of 40 or higher. You have life-threatening health problems related to obesity. Follow these instructions at home: Eating and drinking  Follow advice from your doctor about what to eat and drink. Your doctor may tell you to: Limit fast food, sweets, and processed snack foods. Choose low-fat options. For example, choose low-fat milk instead of whole milk. Eat 5 or more servings of fruits or vegetables each day. Eat at home more often. This gives you more control over what you eat. Choose healthy foods when you eat out. Learn to read food labels. This will help you learn how much food is in 1 serving. Keep low-fat snacks available. Avoid drinks that have a lot of sugar in them. These include soda, fruit juice, iced tea with sugar, and flavored milk. Drink enough water to keep your pee (urine) pale yellow. Do not go on fad diets.  Physical activity Exercise often, as told by your doctor. Most adults should get up to 150 minutes of moderate-intensity exercise every week.Ask your doctor: What types of exercise are safe for you. How often you should exercise. Warm up and stretch before being active. Do slow stretching after being active (cool down). Rest between times of being active. Lifestyle Work with your doctor and a food expert (dietitian) to set a weight-loss goal that is best for you. Limit your screen time. Find ways to reward yourself that do not involve food. Do not drink alcohol if: Your doctor tells you not to drink.   You are pregnant, may be pregnant, or are planning to become pregnant. If you drink alcohol: Limit how much you use to: 0-1 drink a day for women. 0-2 drinks a day for men. Be aware of how much alcohol is in your drink. In the [place] one drink equals one 12 oz bottle of beer (355 mL), one 5 oz  glass of wine (148 mL), or one 1 oz glass of hard liquor (44 mL). General instructions Keep a weight-loss journal. This can help you keep track of: The food that you eat. How much exercise you get. Take over-the-counter and prescription medicines only as told by your doctor. Take vitamins and supplements only as told by your doctor. Think about joining a support group. Keep all follow-up visits as told by your doctor. This is important. Contact a doctor if: You cannot meet your weight loss goal after you have changed your diet and lifestyle for 6 weeks. Get help right away if you: Are having trouble breathing. Are having thoughts of harming yourself. Summary Obesity is having too much body fat. Being obese means that your weight is more than what is healthy for you. Work with your doctor to set a weight-loss goal. Get regular exercise as told by your doctor. This information is not intended to replace advice given to you by your health care provider. Make sure you discuss any questions you have with your healthcare provider. Document Revised: 03/27/2018 Document Reviewed: 03/27/2018 Elsevier Patient Education  2022 Elsevier Inc.  

## 2021-02-24 ENCOUNTER — Other Ambulatory Visit: Payer: Self-pay

## 2021-02-24 DIAGNOSIS — E782 Mixed hyperlipidemia: Secondary | ICD-10-CM

## 2021-02-24 DIAGNOSIS — I10 Essential (primary) hypertension: Secondary | ICD-10-CM

## 2021-02-24 DIAGNOSIS — E1165 Type 2 diabetes mellitus with hyperglycemia: Secondary | ICD-10-CM

## 2021-02-24 MED ORDER — ATENOLOL 25 MG PO TABS: ORAL_TABLET | ORAL | 3 refills | Status: DC

## 2021-02-24 MED ORDER — ATORVASTATIN CALCIUM 20 MG PO TABS: 20.0000 mg | ORAL_TABLET | Freq: Every day | ORAL | 3 refills | Status: DC

## 2021-02-24 MED ORDER — CONTOUR NEXT TEST VI STRP: ORAL_STRIP | 11 refills | Status: DC

## 2021-02-26 DIAGNOSIS — Z20822 Contact with and (suspected) exposure to covid-19: Secondary | ICD-10-CM | POA: Diagnosis not present

## 2021-03-10 ENCOUNTER — Telehealth: Payer: Self-pay

## 2021-03-10 NOTE — Telephone Encounter (Signed)
Pt called that farxiga she like to change due as per lauren I advised her to give few more days and call us back if like to change we will change it

## 2021-04-07 ENCOUNTER — Telehealth: Payer: Self-pay

## 2021-04-07 ENCOUNTER — Other Ambulatory Visit: Payer: Self-pay | Admitting: Internal Medicine

## 2021-04-07 ENCOUNTER — Other Ambulatory Visit: Payer: Self-pay | Admitting: Nurse Practitioner

## 2021-04-07 DIAGNOSIS — E1165 Type 2 diabetes mellitus with hyperglycemia: Secondary | ICD-10-CM

## 2021-04-07 NOTE — Telephone Encounter (Signed)
Spoke with pt she stopped farxiga and she like to go back on glimepiride and metformin extended release  due to pharmacist as per lauren advised her that we can put back on metformin and glimepiride she like to go extended release advised her for now take she had few metformin left at home take that and send glimepiride and make appt with dr Welton Flakes on 9/12 to discuss med

## 2021-04-07 NOTE — Telephone Encounter (Signed)
error 

## 2021-04-17 ENCOUNTER — Other Ambulatory Visit: Payer: Self-pay

## 2021-04-17 ENCOUNTER — Ambulatory Visit (INDEPENDENT_AMBULATORY_CARE_PROVIDER_SITE_OTHER): Payer: BC Managed Care – PPO | Admitting: Internal Medicine

## 2021-04-17 ENCOUNTER — Encounter: Payer: Self-pay | Admitting: Internal Medicine

## 2021-04-17 VITALS — BP 126/80 | HR 87 | Temp 98.2°F | Resp 16 | Ht 62.0 in | Wt 242.2 lb

## 2021-04-17 DIAGNOSIS — E782 Mixed hyperlipidemia: Secondary | ICD-10-CM

## 2021-04-17 DIAGNOSIS — E1165 Type 2 diabetes mellitus with hyperglycemia: Secondary | ICD-10-CM | POA: Diagnosis not present

## 2021-04-17 DIAGNOSIS — M2559 Pain in other specified joint: Secondary | ICD-10-CM | POA: Diagnosis not present

## 2021-04-17 MED ORDER — TRULICITY 1.5 MG/0.5ML ~~LOC~~ SOAJ: 1.5000 mg | SUBCUTANEOUS | 3 refills | Status: DC

## 2021-04-17 NOTE — Progress Notes (Signed)
[facility] [place], [place] [address]  Internal MEDICINE  Office Visit Note  Patient Name: Rebecca Henry  [id number]  [id number]  Date of Service: 04/17/2021  Chief Complaint  Patient presents with   Diabetes    Discuss diabetic medications    HPI Pt is here for f/u after being started on Farxiga 10 mg once a day samples were given. She has been having difficulty tolerating this medicine as she has been going to the bathroom for urination at times she is unable to hold it. Patient had problem with metformin as well where she developed abdominal pain and diarrhea. She does have problem with coverage of this medication due to financial reasons  Current Medication: Outpatient Encounter Medications as of 04/17/2021  Medication Sig   acetaminophen (TYLENOL) 500 MG tablet Take 500 mg by mouth every 6 (six) hours as needed.   ALPRAZolam (XANAX) 0.25 MG tablet Take 1 tablet (0.25 mg total) by mouth at bedtime as needed for anxiety (weeknights after 6pm).   atenolol (TENORMIN) 25 MG tablet Take one tablet by mouth daily   atorvastatin (LIPITOR) 20 MG tablet Take 1 tablet (20 mg total) by mouth daily.   blood glucose meter kit and supplies KIT Dispense based on patient and insurance preference. Use up to four times daily as directed. (FOR ICD-9 250.00, 250.01).   cetirizine (ZYRTEC) 5 MG tablet Take 5 mg by mouth daily.   chlorpheniramine (CHLOR-TRIMETON) 4 MG tablet Take 4 mg by mouth daily.   cyclobenzaprine (FLEXERIL) 5 MG tablet Take 1 tablet (5 mg total) by mouth 2 (two) times daily as needed.   Dulaglutide (TRULICITY) 1.5 XT/0.2IO SOPN Inject 1.5 mg into the skin once a week.   glimepiride (AMARYL) 1 MG tablet Take 1 tablet (1 mg total) by mouth daily with breakfast.   glucose blood (CONTOUR NEXT TEST) test strip Blood sugar testing testing three times daily  DX E11.65   ibuprofen (ADVIL) 200 MG tablet Take 200 mg by mouth every 6 (six) hours as needed.  Takes as needed   loratadine (CLARITIN) 10 MG tablet Take 10 mg by mouth daily as needed.    meloxicam (MOBIC) 15 MG tablet Take 1 tablet (15 mg total) by mouth daily.   [DISCONTINUED] glimepiride (AMARYL) 1 MG tablet Take 1 mg by mouth daily with breakfast. (Patient not taking: Reported on 04/17/2021)   [DISCONTINUED] ibuprofen (ADVIL,MOTRIN) 600 MG tablet Take 1 tablet (600 mg total) by mouth every 6 (six) hours as needed for moderate pain. (Patient not taking: Reported on 04/17/2021)   [DISCONTINUED] metFORMIN (GLUCOPHAGE) 500 MG tablet Take by mouth 2 (two) times daily with a meal. (Patient not taking: Reported on 04/17/2021)   No facility-administered encounter medications on file as of 04/17/2021.    Surgical History: Past Surgical History:  Procedure Laterality Date   CESAREAN SECTION     COLONOSCOPY WITH PROPOFOL N/A 06/02/2019   Procedure: COLONOSCOPY WITH PROPOFOL;  Surgeon: Lucilla Lame, MD;  [facility] ENDOSCOPY;  Service: Endoscopy;  Laterality: N/A;    Medical History: Past Medical History:  Diagnosis Date   Allergic rhinitis    Anxiety    Diabetes mellitus without complication ([place])    Hyperlipidemia    Sleep apnea    Patient states she doesn't have it but it was in history. Did sleep study years ago    Family History: Family History  Problem Relation Age of Onset   Hypertension Mother    Diabetes Maternal Grandmother  Hypothyroidism Maternal Grandmother    Breast cancer Neg Hx     Social History   Socioeconomic History   Marital status: Single    Spouse name: Not on file   Number of children: Not on file   Years of education: Not on file   Highest education level: Not on file  Occupational History   Not on file  Tobacco Use   Smoking status: Never   Smokeless tobacco: Never  Substance and Sexual Activity   Alcohol use: Not Currently   Drug use: No   Sexual activity: Not on file  Other Topics Concern   Not on file  Social History Narrative    Not on file   Social Determinants of Health   Financial Resource Strain: Not on file  Food Insecurity: Not on file  Transportation Needs: Not on file  Physical Activity: Not on file  Stress: Not on file  Social Connections: Not on file  Intimate Partner Violence: Not on file      Review of Systems  Constitutional:  Negative for fatigue and fever.  HENT:  Negative for congestion, mouth sores and postnasal drip.   Respiratory:  Negative for cough.   Cardiovascular:  Negative for chest pain.  Genitourinary:  Negative for flank pain.  Psychiatric/Behavioral: Negative.     Vital Signs: BP 126/80   Pulse 87   Temp 98.2 F (36.8 C)   Resp 16   Ht $R'5\' 2"'dW$  (1.575 m)   Wt 242 lb 3.2 oz (109.9 kg)   SpO2 96%   BMI 44.30 kg/m    Physical Exam Constitutional:      Appearance: Normal appearance.  HENT:     Head: Normocephalic and atraumatic.     Nose: Nose normal.     Mouth/Throat:     Mouth: Mucous membranes are moist.     Pharynx: No posterior oropharyngeal erythema.  Eyes:     Extraocular Movements: Extraocular movements intact.     Pupils: Pupils are equal, round, and reactive to light.  Cardiovascular:     Pulses: Normal pulses.     Heart sounds: Normal heart sounds.  Pulmonary:     Effort: Pulmonary effort is normal.     Breath sounds: Normal breath sounds.  Neurological:     General: No focal deficit present.     Mental Status: She is alert.  Psychiatric:        Mood and Affect: Mood normal.        Behavior: Behavior normal.       Assessment/Plan: 1. Type 2 diabetes mellitus with hyperglycemia, without long-term current use of insulin ([place]) Patient is given samples of Trulicity 2.03 mg to be taken weekly stop metformin due to intolerance and side effects.  Wilder Glade due to intolerance and side effects stop Amaryl due to infrequent events of hypoglycemia  2. Mixed hyperlipidemia Decrease Lipitor to 10 mg once a day patient LDL is below 100  3. Pain in other  joint Patient has multilevel symptoms of arthralgia takes Tylenol and Motrin on a leg regular basis we will further evaluate this with serologies and testing  General Counseling: rachele lamaster understanding of the findings of todays visit and agrees with plan of treatment. I have discussed any further diagnostic evaluation that may be needed or ordered today. We also reviewed her medications today. she has been encouraged to call the office with any questions or concerns that should arise related to todays visit.    No orders of the  defined types were placed in this encounter.   Meds ordered this encounter  Medications   Dulaglutide (TRULICITY) 1.5 UP/7.3HD SOPN    Sig: Inject 1.5 mg into the skin once a week.    Dispense:  6 mL    Refill:  3     Total time spent:35 Minutes Time spent includes review of chart, medications, test results, and follow up plan with the patient.   [place] Controlled Substance Database was reviewed by me.   Dr Lavera Guise Internal medicine

## 2021-04-21 ENCOUNTER — Telehealth: Payer: Self-pay

## 2021-04-21 NOTE — Telephone Encounter (Signed)
Advised pt samples ready for pickup for trulicity

## 2021-04-21 NOTE — Telephone Encounter (Signed)
Pt called  that left her samples for trulicity in car so advised her disregard that and she can come pickup samples

## 2021-05-01 ENCOUNTER — Encounter: Payer: Self-pay | Admitting: Nurse Practitioner

## 2021-05-01 ENCOUNTER — Other Ambulatory Visit: Payer: Self-pay

## 2021-05-01 ENCOUNTER — Ambulatory Visit: Payer: BC Managed Care – PPO | Admitting: Nurse Practitioner

## 2021-05-01 VITALS — BP 134/68 | HR 81 | Temp 98.4°F | Resp 16 | Ht 62.0 in | Wt 241.8 lb

## 2021-05-01 DIAGNOSIS — R17 Unspecified jaundice: Secondary | ICD-10-CM | POA: Diagnosis not present

## 2021-05-01 DIAGNOSIS — E782 Mixed hyperlipidemia: Secondary | ICD-10-CM | POA: Diagnosis not present

## 2021-05-01 DIAGNOSIS — H11433 Conjunctival hyperemia, bilateral: Secondary | ICD-10-CM | POA: Diagnosis not present

## 2021-05-01 DIAGNOSIS — H539 Unspecified visual disturbance: Secondary | ICD-10-CM

## 2021-05-01 DIAGNOSIS — E1165 Type 2 diabetes mellitus with hyperglycemia: Secondary | ICD-10-CM | POA: Diagnosis not present

## 2021-05-01 DIAGNOSIS — E119 Type 2 diabetes mellitus without complications: Secondary | ICD-10-CM | POA: Diagnosis not present

## 2021-05-01 DIAGNOSIS — H25013 Cortical age-related cataract, bilateral: Secondary | ICD-10-CM | POA: Diagnosis not present

## 2021-05-01 DIAGNOSIS — E089 Diabetes mellitus due to underlying condition without complications: Secondary | ICD-10-CM | POA: Diagnosis not present

## 2021-05-01 MED ORDER — ATORVASTATIN CALCIUM 10 MG PO TABS: 10.0000 mg | ORAL_TABLET | Freq: Every day | ORAL | 3 refills | Status: DC

## 2021-05-01 MED ORDER — METFORMIN HCL ER 750 MG PO TB24: 750.0000 mg | ORAL_TABLET | Freq: Every day | ORAL | 2 refills | Status: DC

## 2021-05-01 NOTE — Progress Notes (Signed)
[facility] [address] [place], [place] [address]  Internal MEDICINE  Office Visit Note  Patient Name: Rebecca Henry  [id number]  [id number]  Date of Service: 05/01/2021  Chief Complaint  Patient presents with   Acute Visit    Blurry vision/change in vision, blood shot eyes, eyes hurt, refills    HPI [name] presents for follow up visit for possible adverse reaction to trulicity. She is going to eye doctor today. Reports blurry vision since her first trulicity injection, eye pain, and blood shot eyes.   Current Medication: Outpatient Encounter Medications as of 05/01/2021  Medication Sig   acetaminophen (TYLENOL) 500 MG tablet Take 500 mg by mouth every 6 (six) hours as needed.   ALPRAZolam (XANAX) 0.25 MG tablet Take 1 tablet (0.25 mg total) by mouth at bedtime as needed for anxiety (weeknights after 6pm).   atenolol (TENORMIN) 25 MG tablet Take one tablet by mouth daily   atorvastatin (LIPITOR) 10 MG tablet Take 1 tablet (10 mg total) by mouth daily.   blood glucose meter kit and supplies KIT Dispense based on patient and insurance preference. Use up to four times daily as directed. (FOR ICD-9 250.00, 250.01).   cetirizine (ZYRTEC) 5 MG tablet Take 5 mg by mouth daily.   chlorpheniramine (CHLOR-TRIMETON) 4 MG tablet Take 4 mg by mouth daily.   cyclobenzaprine (FLEXERIL) 5 MG tablet Take 1 tablet (5 mg total) by mouth 2 (two) times daily as needed.   glimepiride (AMARYL) 1 MG tablet Take 1 tablet (1 mg total) by mouth daily with breakfast.   glucose blood (CONTOUR NEXT TEST) test strip Blood sugar testing testing three times daily  DX E11.65   ibuprofen (ADVIL) 200 MG tablet Take 200 mg by mouth every 6 (six) hours as needed. Takes as needed   loratadine (CLARITIN) 10 MG tablet Take 10 mg by mouth daily as needed.    meloxicam (MOBIC) 15 MG tablet Take 1 tablet (15 mg total) by mouth daily.   metFORMIN (GLUCOPHAGE-XR) 750 MG 24 hr tablet Take 1 tablet (750 mg total)  by mouth daily with breakfast.   [DISCONTINUED] atorvastatin (LIPITOR) 20 MG tablet Take 1 tablet (20 mg total) by mouth daily. (Patient taking differently: Take 10 mg by mouth daily.)   [DISCONTINUED] Dulaglutide (TRULICITY) 1.5 MG/0.5ML SOPN Inject 1.5 mg into the skin once a week. (Patient not taking: Reported on 05/01/2021)   No facility-administered encounter medications on file as of 05/01/2021.    Surgical History: Past Surgical History:  Procedure Laterality Date   CESAREAN SECTION     COLONOSCOPY WITH PROPOFOL N/A 06/02/2019   Procedure: COLONOSCOPY WITH PROPOFOL;  Surgeon: Midge Minium, MD;  Location[facility] ENDOSCOPY;  Service: Endoscopy;  Laterality: N/A;    Medical History: Past Medical History:  Diagnosis Date   Allergic rhinitis    Anxiety    Diabetes mellitus without complication ([facility])    Hyperlipidemia    Sleep apnea    Patient states she doesn't have it but it was in history. Did sleep study years ago    Family History: Family History  Problem Relation Age of Onset   Hypertension Mother    Diabetes Maternal Grandmother    Hypothyroidism Maternal Grandmother    Breast cancer Neg Hx     Social History   Socioeconomic History   Marital status: Single    Spouse name: Not on file   Number of children: Not on file   Years of education: Not on file  Highest education level: Not on file  Occupational History   Not on file  Tobacco Use   Smoking status: Never   Smokeless tobacco: Never  Substance and Sexual Activity   Alcohol use: Not Currently   Drug use: No   Sexual activity: Not on file  Other Topics Concern   Not on file  Social History Narrative   Not on file   Social Determinants of Health   Financial Resource Strain: Not on file  Food Insecurity: Not on file  Transportation Needs: Not on file  Physical Activity: Not on file  Stress: Not on file  Social Connections: Not on file  Intimate Partner Violence: Not on file      Review of  Systems  Constitutional:  Negative for chills, fatigue and fever.  HENT: Negative.    Eyes:  Positive for pain, redness and visual disturbance.  Respiratory: Negative.  Negative for cough, chest tightness, shortness of breath and wheezing.   Cardiovascular: Negative.  Negative for chest pain and palpitations.  Neurological: Negative.  Negative for headaches.   Vital Signs: BP 134/68   Pulse 81   Temp 98.4 F (36.9 C)   Resp 16   Ht $R'5\' 2"'Rw$  (1.575 m)   Wt 241 lb 12.8 oz (109.7 kg)   SpO2 98%   BMI 44.23 kg/m    Physical Exam Vitals reviewed.  Constitutional:      General: She is not in acute distress.    Appearance: Normal appearance. She is obese. She is not ill-appearing.  HENT:     Head: Normocephalic and atraumatic.  Eyes:     General: Lids are normal. Vision grossly intact. Gaze aligned appropriately. Scleral icterus present.     Extraocular Movements: Extraocular movements intact.     Conjunctiva/sclera:     Right eye: Right conjunctiva is injected.     Left eye: Left conjunctiva is injected.  Cardiovascular:     Rate and Rhythm: Normal rate and regular rhythm.  Pulmonary:     Effort: Pulmonary effort is normal. No respiratory distress.  Neurological:     Mental Status: She is alert and oriented to person, place, and time.     Cranial Nerves: No cranial nerve deficit.     Coordination: Coordination normal.     Gait: Gait normal.  Psychiatric:        Mood and Affect: Mood normal.        Behavior: Behavior normal.       Assessment/Plan: 1. Change in vision Truclity discontinued, trulicity has possible increased risk of causing or worsening diabetic retinopathy. Due to abrupt changes in vision, medication will be stopped.  2. Scleral icterus Yellowing of sclera noted, lab ordered to check liver function. - CMP14+EGFR  3. Type 2 diabetes mellitus with hyperglycemia, without long-term current use of insulin ([place]) Trulicity stopped, start metformin as  prescribed.  - metFORMIN (GLUCOPHAGE-XR) 750 MG 24 hr tablet; Take 1 tablet (750 mg total) by mouth daily with breakfast.  Dispense: 30 tablet; Refill: 2  4. Mixed hyperlipidemia Refill ordered.  - atorvastatin (LIPITOR) 10 MG tablet; Take 1 tablet (10 mg total) by mouth daily.  Dispense: 90 tablet; Refill: 3   General Counseling: Geneveive verbalizes understanding of the findings of todays visit and agrees with plan of treatment. I have discussed any further diagnostic evaluation that may be needed or ordered today. We also reviewed her medications today. she has been encouraged to call the office with any questions or concerns that should  arise related to todays visit.    Orders Placed This Encounter  Procedures   CMP14+EGFR    Meds ordered this encounter  Medications   metFORMIN (GLUCOPHAGE-XR) 750 MG 24 hr tablet    Sig: Take 1 tablet (750 mg total) by mouth daily with breakfast.    Dispense:  30 tablet    Refill:  2   atorvastatin (LIPITOR) 10 MG tablet    Sig: Take 1 tablet (10 mg total) by mouth daily.    Dispense:  90 tablet    Refill:  3    Return if symptoms worsen or fail to improve.   Total time spent:20 Minutes Time spent includes review of chart, medications, test results, and follow up plan with the patient.   [place] Controlled Substance Database was reviewed by me.  This patient was seen by Jonetta Osgood, FNP-C in collaboration with Dr. Clayborn Bigness as a part of collaborative care agreement.   Tion Tse R. Valetta Fuller, MSN, FNP-C Internal medicine

## 2021-05-02 LAB — CMP14+EGFR
ALT: 32 IU/L (ref 0–32)
AST: 23 IU/L (ref 0–40)
Albumin/Globulin Ratio: 1.8 (ref 1.2–2.2)
Albumin: 4.7 g/dL (ref 3.8–4.9)
Alkaline Phosphatase: 112 IU/L (ref 44–121)
BUN/Creatinine Ratio: 11 (ref 9–23)
BUN: 9 mg/dL (ref 6–24)
Bilirubin Total: 0.5 mg/dL (ref 0.0–1.2)
CO2: 24 mmol/L (ref 20–29)
Calcium: 9.6 mg/dL (ref 8.7–10.2)
Chloride: 99 mmol/L (ref 96–106)
Creatinine, Ser: 0.84 mg/dL (ref 0.57–1.00)
Globulin, Total: 2.6 g/dL (ref 1.5–4.5)
Glucose: 219 mg/dL — ABNORMAL HIGH (ref 70–99)
Potassium: 4.4 mmol/L (ref 3.5–5.2)
Sodium: 138 mmol/L (ref 134–144)
Total Protein: 7.3 g/dL (ref 6.0–8.5)
eGFR: 84 mL/min/{1.73_m2} (ref >59)

## 2021-05-18 ENCOUNTER — Ambulatory Visit: Payer: BC Managed Care – PPO | Admitting: Physician Assistant

## 2021-06-12 ENCOUNTER — Ambulatory Visit: Payer: BC Managed Care – PPO | Admitting: Physician Assistant

## 2021-06-12 ENCOUNTER — Encounter: Payer: Self-pay | Admitting: Physician Assistant

## 2021-06-12 ENCOUNTER — Other Ambulatory Visit: Payer: Self-pay

## 2021-06-12 DIAGNOSIS — M545 Low back pain, unspecified: Secondary | ICD-10-CM | POA: Diagnosis not present

## 2021-06-12 DIAGNOSIS — F410 Panic disorder [episodic paroxysmal anxiety] without agoraphobia: Secondary | ICD-10-CM

## 2021-06-12 DIAGNOSIS — F411 Generalized anxiety disorder: Secondary | ICD-10-CM | POA: Diagnosis not present

## 2021-06-12 DIAGNOSIS — E1165 Type 2 diabetes mellitus with hyperglycemia: Secondary | ICD-10-CM | POA: Diagnosis not present

## 2021-06-12 DIAGNOSIS — I10 Essential (primary) hypertension: Secondary | ICD-10-CM

## 2021-06-12 LAB — POCT GLYCOSYLATED HEMOGLOBIN (HGB A1C): Hemoglobin A1C: 9 % — AB (ref 4.0–5.6)

## 2021-06-12 MED ORDER — MELOXICAM 15 MG PO TABS: 15.0000 mg | ORAL_TABLET | Freq: Every day | ORAL | 3 refills | Status: DC

## 2021-06-12 MED ORDER — ATENOLOL 25 MG PO TABS: ORAL_TABLET | ORAL | 3 refills | Status: DC

## 2021-06-12 MED ORDER — CYCLOBENZAPRINE HCL 5 MG PO TABS: 5.0000 mg | ORAL_TABLET | Freq: Two times a day (BID) | ORAL | 2 refills | Status: DC | PRN

## 2021-06-12 NOTE — Progress Notes (Signed)
[facility] [place], [place] [address]  Internal MEDICINE  Office Visit Note  Patient Name: Rebecca Henry  [id number]  [id number]  Date of Service: 06/12/2021  Chief Complaint  Patient presents with   Follow-up   Diabetes   Hyperlipidemia   Anxiety    HPI Pt is here for routine follow up -BG at home in AM not checked recently, but was running 150-200--discussed looking into continuous monitor such as Dexacom/freestyle -Has been taking 527m metformin once per day and 1/2 tab glimepiride.   -The 7550mER metformin caused GI upset, as did taking 50059mID previously. Tolerates the single tab of 500m6mtformin though -had reaction to trulicity that led to problems with her eyes with eyesight worsening. Eyes also looked more blood shot and tinged. Eye doctor was seen and thought everything looked ok and is doing better since stopping the medication -Previously tried farxiga with S/E as well. Based on this will try Janumet 50/500mg80m hold glimepiride initially. May need to add back glimepiride 1/2 tab up to BID once she sees how she responds to the medication. Did also discuss possible endocrinology referral if not tolerating/responding -Taking Meloxicam and flexeril as needed for back flares and requests refills -still taking xanax 1/2 tab prn, does not need refill  Current Medication: Outpatient Encounter Medications as of 06/12/2021  Medication Sig   acetaminophen (TYLENOL) 500 MG tablet Take 500 mg by mouth every 6 (six) hours as needed.   ALPRAZolam (XANAX) 0.25 MG tablet Take 1 tablet (0.25 mg total) by mouth at bedtime as needed for anxiety (weeknights after 6pm).   atorvastatin (LIPITOR) 10 MG tablet Take 1 tablet (10 mg total) by mouth daily.   blood glucose meter kit and supplies KIT Dispense based on patient and insurance preference. Use up to four times daily as directed. (FOR ICD-9 250.00, 250.01).   cetirizine (ZYRTEC) 5 MG tablet Take 5 mg by  mouth daily.   chlorpheniramine (CHLOR-TRIMETON) 4 MG tablet Take 4 mg by mouth daily.   glimepiride (AMARYL) 1 MG tablet Take 1 tablet (1 mg total) by mouth daily with breakfast.   glucose blood (CONTOUR NEXT TEST) test strip Blood sugar testing testing three times daily  DX E11.65   ibuprofen (ADVIL) 200 MG tablet Take 200 mg by mouth every 6 (six) hours as needed. Takes as needed   loratadine (CLARITIN) 10 MG tablet Take 10 mg by mouth daily as needed.    [DISCONTINUED] atenolol (TENORMIN) 25 MG tablet Take one tablet by mouth daily   [DISCONTINUED] cyclobenzaprine (FLEXERIL) 5 MG tablet Take 1 tablet (5 mg total) by mouth 2 (two) times daily as needed.   [DISCONTINUED] meloxicam (MOBIC) 15 MG tablet Take 1 tablet (15 mg total) by mouth daily.   [DISCONTINUED] metFORMIN (GLUCOPHAGE-XR) 750 MG 24 hr tablet Take 1 tablet (750 mg total) by mouth daily with breakfast.   atenolol (TENORMIN) 25 MG tablet Take one tablet by mouth daily   cyclobenzaprine (FLEXERIL) 5 MG tablet Take 1 tablet (5 mg total) by mouth 2 (two) times daily as needed.   meloxicam (MOBIC) 15 MG tablet Take 1 tablet (15 mg total) by mouth daily.   No facility-administered encounter medications on file as of 06/12/2021.    Surgical History: Past Surgical History:  Procedure Laterality Date   CESAREAN SECTION     COLONOSCOPY WITH PROPOFOL N/A 06/02/2019   Procedure: COLONOSCOPY WITH PROPOFOL;  Surgeon: Wohl,Lucilla Lame  Location: [facility] ENDOSCOPY;  Service:  Endoscopy;  Laterality: N/A;    Medical History: Past Medical History:  Diagnosis Date   Allergic rhinitis    Anxiety    Diabetes mellitus without complication ([place])    Hyperlipidemia    Sleep apnea    Patient states she doesn't have it but it was in history. Did sleep study years ago    Family History: Family History  Problem Relation Age of Onset   Hypertension Mother    Diabetes Maternal Grandmother    Hypothyroidism Maternal Grandmother    Breast cancer  Neg Hx     Social History   Socioeconomic History   Marital status: Single    Spouse name: Not on file   Number of children: Not on file   Years of education: Not on file   Highest education level: Not on file  Occupational History   Not on file  Tobacco Use   Smoking status: Never   Smokeless tobacco: Never  Substance and Sexual Activity   Alcohol use: Not Currently   Drug use: No   Sexual activity: Not on file  Other Topics Concern   Not on file  Social History Narrative   Not on file   Social Determinants of Health   Financial Resource Strain: Not on file  Food Insecurity: Not on file  Transportation Needs: Not on file  Physical Activity: Not on file  Stress: Not on file  Social Connections: Not on file  Intimate Partner Violence: Not on file      Review of Systems  Constitutional:  Negative for chills, fatigue and unexpected weight change.  HENT:  Negative for congestion, postnasal drip, rhinorrhea, sneezing and sore throat.   Eyes:  Negative for redness.  Respiratory:  Negative for cough, chest tightness and shortness of breath.   Cardiovascular:  Negative for chest pain and palpitations.  Gastrointestinal:  Negative for abdominal pain, constipation, diarrhea, nausea and vomiting.  Genitourinary:  Negative for dysuria and frequency.  Musculoskeletal:  Negative for arthralgias, back pain, joint swelling and neck pain.  Skin:  Negative for rash.  Neurological: Negative.  Negative for tremors and numbness.  Hematological:  Negative for adenopathy. Does not bruise/bleed easily.  Psychiatric/Behavioral:  Negative for behavioral problems (Depression), sleep disturbance and suicidal ideas. The patient is not nervous/anxious.    Vital Signs: BP 131/71   Pulse 81   Temp 98 F (36.7 C)   Resp 16   Ht _0  (1.575 m)   Wt 245 lb (111.1 kg)   SpO2 95%   BMI 44.81 kg/m    Physical Exam Vitals and nursing note reviewed.  Constitutional:      General: She is  not in acute distress.    Appearance: She is well-developed. She is obese. She is not diaphoretic.  HENT:     Head: Normocephalic and atraumatic.     Mouth/Throat:     Pharynx: No oropharyngeal exudate.  Eyes:     Pupils: Pupils are equal, round, and reactive to light.  Neck:     Thyroid: No thyromegaly.     Vascular: No JVD.     Trachea: No tracheal deviation.  Cardiovascular:     Rate and Rhythm: Normal rate and regular rhythm.     Heart sounds: Normal heart sounds. No murmur heard.   No friction rub. No gallop.  Pulmonary:     Effort: Pulmonary effort is normal. No respiratory distress.     Breath sounds: No wheezing or rales.  Chest:  Chest wall: No tenderness.  Abdominal:     General: Bowel sounds are normal.     Palpations: Abdomen is soft.  Musculoskeletal:        General: Normal range of motion.     Cervical back: Normal range of motion and neck supple.  Lymphadenopathy:     Cervical: No cervical adenopathy.  Skin:    General: Skin is warm and dry.  Neurological:     Mental Status: She is alert and oriented to person, place, and time.     Cranial Nerves: No cranial nerve deficit.  Psychiatric:        Behavior: Behavior normal.        Thought Content: Thought content normal.        Judgment: Judgment normal.       Assessment/Plan: 1. Type 2 diabetes mellitus with hyperglycemia, without long-term current use of insulin (HCC) - POCT HgB A1C is 9.0 which is increased from 8.3 last visit. Given samples of Janumet 50/500 to try for 4 weeks, if responding well will send script. Will hold glimepiride until she sees how she responds to [place].  Will likely need to restart glimepiride at half tablet 1-2 times per day pending response in sugars.  Advised to work on diet and exercise and monitor blood sugars closely.  May have to consider endocrinology referral if intolerance to Janumet due to prior side effects of both Trulicity and [place] as well as side effects to  increased metformin above 500/day  2. Essential hypertension Stable, may continue current medications - atenolol (TENORMIN) 25 MG tablet; Take one tablet by mouth daily  Dispense: 30 tablet; Refill: 3  3. Low back pain at multiple sites May take meloxicam and/or Flexeril as needed - meloxicam (MOBIC) 15 MG tablet; Take 1 tablet (15 mg total) by mouth daily.  Dispense: 30 tablet; Refill: 3 - cyclobenzaprine (FLEXERIL) 5 MG tablet; Take 1 tablet (5 mg total) by mouth 2 (two) times daily as needed.  Dispense: 45 tablet; Refill: 2  4. Generalized anxiety disorder with panic attacks Stable, may continue Xanax as needed   General Counseling: sherae santino understanding of the findings of todays visit and agrees with plan of treatment. I have discussed any further diagnostic evaluation that may be needed or ordered today. We also reviewed her medications today. she has been encouraged to call the office with any questions or concerns that should arise related to todays visit.    Orders Placed This Encounter  Procedures   POCT HgB A1C    Meds ordered this encounter  Medications   meloxicam (MOBIC) 15 MG tablet    Sig: Take 1 tablet (15 mg total) by mouth daily.    Dispense:  30 tablet    Refill:  3   cyclobenzaprine (FLEXERIL) 5 MG tablet    Sig: Take 1 tablet (5 mg total) by mouth 2 (two) times daily as needed.    Dispense:  45 tablet    Refill:  2   atenolol (TENORMIN) 25 MG tablet    Sig: Take one tablet by mouth daily    Dispense:  30 tablet    Refill:  3    This patient was seen by Drema Dallas, PA-C in collaboration with Dr. Clayborn Bigness as a part of collaborative care agreement.   Total time spent:35 Minutes Time spent includes review of chart, medications, test results, and follow up plan with the patient.      Dr Lavera Guise Internal medicine

## 2021-06-13 ENCOUNTER — Other Ambulatory Visit: Payer: Self-pay

## 2021-06-13 MED ORDER — FREESTYLE LIBRE 2 SENSOR MISC: 3 refills | Status: DC

## 2021-06-13 MED ORDER — FREESTYLE LIBRE 2 READER DEVI: 3 refills | Status: DC

## 2021-07-10 ENCOUNTER — Telehealth: Payer: BC Managed Care – PPO | Admitting: Nurse Practitioner

## 2021-07-10 ENCOUNTER — Encounter: Payer: Self-pay | Admitting: Nurse Practitioner

## 2021-07-10 ENCOUNTER — Ambulatory Visit: Payer: BC Managed Care – PPO | Admitting: Physician Assistant

## 2021-07-10 ENCOUNTER — Other Ambulatory Visit: Payer: Self-pay

## 2021-07-10 VITALS — Temp 100.8°F | Resp 16 | Ht 62.75 in | Wt 245.0 lb

## 2021-07-10 DIAGNOSIS — J018 Other acute sinusitis: Secondary | ICD-10-CM

## 2021-07-10 DIAGNOSIS — E1165 Type 2 diabetes mellitus with hyperglycemia: Secondary | ICD-10-CM | POA: Diagnosis not present

## 2021-07-10 MED ORDER — AMOXICILLIN-POT CLAVULANATE 875-125 MG PO TABS: 1.0000 | ORAL_TABLET | Freq: Two times a day (BID) | ORAL | 0 refills | Status: DC

## 2021-07-10 MED ORDER — SITAGLIP PHOS-METFORMIN HCL ER 50-500 MG PO TB24: 1.0000 | ORAL_TABLET | Freq: Every day | ORAL | 2 refills | Status: DC

## 2021-07-10 NOTE — Progress Notes (Signed)
[facility] [place], [place] [address]  Internal MEDICINE  Telephone Visit  Patient Name: Rebecca Henry  [id number]  [id number]  Date of Service: 07/10/2021  I connected with the patient at 12:15 PM by telephone and verified the patients identity using two identifiers.   I discussed the limitations, risks, security and privacy concerns of performing an evaluation and management service by telephone and the availability of in person appointments. I also discussed with the patient that there may be a patient responsible charge related to the service.  The patient expressed understanding and agrees to proceed.    Chief Complaint  Patient presents with   Telephone Assessment    [phone]   Telephone Screen   Sore Throat    Started last Saturday 06/30/21. Negative Covid test on Tuesday and today   Cough    Mucus not sure if any color    Fever   Eye Drainage    Yellow and green mucus drainage in eyes    HPI Rebecca Henry presents for a telehealth virtual visit for symptoms of sinusitis. She reports that her symptoms started on 06/30/21. She was negative for covid last week on Tuesday and negative for covid today on her home covid test. She thought it was allergy symptoms at first. She developed a sore throat and fatigue. She also has had yellow/green eye drainage and a low grade fever.      Current Medication: Outpatient Encounter Medications as of 07/10/2021  Medication Sig   acetaminophen (TYLENOL) 500 MG tablet Take 500 mg by mouth every 6 (six) hours as needed.   ALPRAZolam (XANAX) 0.25 MG tablet Take 1 tablet (0.25 mg total) by mouth at bedtime as needed for anxiety (weeknights after 6pm).   amoxicillin-clavulanate (AUGMENTIN) 875-125 MG tablet Take 1 tablet by mouth 2 (two) times daily. Take with food   atenolol (TENORMIN) 25 MG tablet Take one tablet by mouth daily   atorvastatin (LIPITOR) 10 MG tablet Take 1 tablet (10 mg total) by mouth daily.   blood  glucose meter kit and supplies KIT Dispense based on patient and insurance preference. Use up to four times daily as directed. (FOR ICD-9 250.00, 250.01).   cetirizine (ZYRTEC) 5 MG tablet Take 5 mg by mouth daily.   chlorpheniramine (CHLOR-TRIMETON) 4 MG tablet Take 4 mg by mouth daily.   Continuous Blood Gluc Receiver (FREESTYLE LIBRE 2 READER) DEVI Use every 14 days to check blood sugars E11.65   Continuous Blood Gluc Sensor (FREESTYLE LIBRE 2 SENSOR) MISC Use every 14 days to check blood sugars E11.65   cyclobenzaprine (FLEXERIL) 5 MG tablet Take 1 tablet (5 mg total) by mouth 2 (two) times daily as needed.   glimepiride (AMARYL) 1 MG tablet Take 1 tablet (1 mg total) by mouth daily with breakfast.   glucose blood (CONTOUR NEXT TEST) test strip Blood sugar testing testing three times daily  DX E11.65   ibuprofen (ADVIL) 200 MG tablet Take 200 mg by mouth every 6 (six) hours as needed. Takes as needed   loratadine (CLARITIN) 10 MG tablet Take 10 mg by mouth daily as needed.    meloxicam (MOBIC) 15 MG tablet Take 1 tablet (15 mg total) by mouth daily.   SitaGLIPtin-MetFORMIN HCl 50-500 MG TB24 Take 1 tablet by mouth daily.   No facility-administered encounter medications on file as of 07/10/2021.    Surgical History: Past Surgical History:  Procedure Laterality Date   CESAREAN SECTION     COLONOSCOPY WITH  PROPOFOL N/A 06/02/2019   Procedure: COLONOSCOPY WITH PROPOFOL;  Surgeon: Lucilla Lame, MD;  Location: [facility] ENDOSCOPY;  Service: Endoscopy;  Laterality: N/A;    Medical History: Past Medical History:  Diagnosis Date   Allergic rhinitis    Anxiety    Diabetes mellitus without complication ([place])    Hyperlipidemia    Sleep apnea    Patient states she doesn't have it but it was in history. Did sleep study years ago    Family History: Family History  Problem Relation Age of Onset   Hypertension Mother    Diabetes Maternal Grandmother    Hypothyroidism Maternal Grandmother     Breast cancer Neg Hx     Social History   Socioeconomic History   Marital status: Single    Spouse name: Not on file   Number of children: Not on file   Years of education: Not on file   Highest education level: Not on file  Occupational History   Not on file  Tobacco Use   Smoking status: Never   Smokeless tobacco: Never  Substance and Sexual Activity   Alcohol use: Not Currently   Drug use: No   Sexual activity: Not on file  Other Topics Concern   Not on file  Social History Narrative   Not on file   Social Determinants of Health   Financial Resource Strain: Not on file  Food Insecurity: Not on file  Transportation Needs: Not on file  Physical Activity: Not on file  Stress: Not on file  Social Connections: Not on file  Intimate Partner Violence: Not on file      Review of Systems  Constitutional:  Positive for appetite change, chills, fatigue and fever.  HENT:  Positive for congestion, ear pain, postnasal drip, sore throat and trouble swallowing.        Swollen lymph nodes right neck  Eyes:  Positive for discharge (yellow and green).  Respiratory:  Positive for cough (mild) and shortness of breath. Negative for chest tightness and wheezing.   Cardiovascular: Negative.  Negative for chest pain and palpitations.  Gastrointestinal: Negative.  Negative for abdominal pain, constipation, diarrhea, nausea and vomiting.  Musculoskeletal:  Positive for myalgias.  Skin: Negative.  Negative for rash.  Neurological:  Positive for headaches. Negative for dizziness and light-headedness.   Vital Signs: Temp (!) 100.8 F (38.2 C)   Resp 16   Ht 5' 2.75" (1.594 m)   Wt 245 lb (111.1 kg)   BMI 43.75 kg/m    Observation/Objective: She is alert and oriented and engages in conversation appropriately. She does not appear to be in any acute distress over video call.     Assessment/Plan: 1. Acute non-recurrent sinusitis of other sinus Empiric antibiotic treatment  prescribed.  - amoxicillin-clavulanate (AUGMENTIN) 875-125 MG tablet; Take 1 tablet by mouth 2 (two) times daily. Take with food  Dispense: 20 tablet; Refill: 0  2. Type 2 diabetes mellitus with hyperglycemia, without long-term current use of insulin (HCC) Janumet is working well for patient, given samples at last office visit, prescription sent to pharmacy.  - SitaGLIPtin-MetFORMIN HCl 50-500 MG TB24; Take 1 tablet by mouth daily.  Dispense: 30 tablet; Refill: 2   General Counseling: ailee pates understanding of the findings of today's phone visit and agrees with plan of treatment. I have discussed any further diagnostic evaluation that may be needed or ordered today. We also reviewed her medications today. she has been encouraged to call the office with any questions or concerns  that should arise related to todays visit.  Return if symptoms worsen or fail to improve.   No orders of the defined types were placed in this encounter.   Meds ordered this encounter  Medications   amoxicillin-clavulanate (AUGMENTIN) 875-125 MG tablet    Sig: Take 1 tablet by mouth 2 (two) times daily. Take with food    Dispense:  20 tablet    Refill:  0   SitaGLIPtin-MetFORMIN HCl 50-500 MG TB24    Sig: Take 1 tablet by mouth daily.    Dispense:  30 tablet    Refill:  2    Time spent:10 Minutes Time spent with patient included reviewing progress notes, labs, imaging studies, and discussing plan for follow up.  [place] Controlled Substance Database was reviewed by me for overdose risk score (ORS) if appropriate.  This patient was seen by Jonetta Osgood, FNP-C in collaboration with Dr. Clayborn Bigness as a part of collaborative care agreement.  Maudine Kluesner R. Valetta Fuller, MSN, FNP-C Internal medicine

## 2021-07-18 ENCOUNTER — Other Ambulatory Visit: Payer: BC Managed Care – PPO | Admitting: Nurse Practitioner

## 2021-07-20 ENCOUNTER — Other Ambulatory Visit: Payer: BC Managed Care – PPO | Admitting: Physician Assistant

## 2021-08-28 ENCOUNTER — Other Ambulatory Visit: Payer: Self-pay

## 2021-08-28 ENCOUNTER — Emergency Department: Payer: BC Managed Care – PPO

## 2021-08-28 ENCOUNTER — Emergency Department
Admission: EM | Admit: 2021-08-28 | Discharge: 2021-08-28 | Disposition: A | Discharge status: Home / Self Care | Payer: BC Managed Care – PPO | Attending: Emergency Medicine | Admitting: Emergency Medicine

## 2021-08-28 ENCOUNTER — Encounter: Payer: Self-pay | Admitting: Emergency Medicine

## 2021-08-28 DIAGNOSIS — R1011 Right upper quadrant pain: Secondary | ICD-10-CM | POA: Diagnosis not present

## 2021-08-28 DIAGNOSIS — K2900 Acute gastritis without bleeding: Secondary | ICD-10-CM | POA: Diagnosis not present

## 2021-08-28 DIAGNOSIS — K760 Fatty (change of) liver, not elsewhere classified: Secondary | ICD-10-CM | POA: Diagnosis not present

## 2021-08-28 DIAGNOSIS — R112 Nausea with vomiting, unspecified: Secondary | ICD-10-CM | POA: Diagnosis not present

## 2021-08-28 LAB — URINALYSIS, ROUTINE W REFLEX MICROSCOPIC
Glucose, UA: NEGATIVE mg/dL
Hgb urine dipstick: NEGATIVE
Leukocytes,Ua: NEGATIVE
Nitrite: NEGATIVE
Protein, ur: 100 mg/dL — AB
Specific Gravity, Urine: 1.025 (ref 1.005–1.030)
pH: 5.5 (ref 5.0–8.0)

## 2021-08-28 LAB — CBC
HCT: 46.5 % — ABNORMAL HIGH (ref 36.0–46.0)
Hemoglobin: 15.9 g/dL — ABNORMAL HIGH (ref 12.0–15.0)
MCH: 30.5 pg (ref 26.0–34.0)
MCHC: 34.2 g/dL (ref 30.0–36.0)
MCV: 89.3 fL (ref 80.0–100.0)
Platelets: 249 10*3/uL (ref 150–400)
RBC: 5.21 MIL/uL — ABNORMAL HIGH (ref 3.87–5.11)
RDW: 12 % (ref 11.5–15.5)
WBC: 7.7 10*3/uL (ref 4.0–10.5)
nRBC: 0 % (ref 0.0–0.2)

## 2021-08-28 LAB — COMPREHENSIVE METABOLIC PANEL
ALT: 44 U/L (ref 0–44)
AST: 44 U/L — ABNORMAL HIGH (ref 15–41)
Albumin: 4.3 g/dL (ref 3.5–5.0)
Alkaline Phosphatase: 84 U/L (ref 38–126)
Anion gap: 11 (ref 5–15)
BUN: 15 mg/dL (ref 6–20)
CO2: 25 mmol/L (ref 22–32)
Calcium: 9.4 mg/dL (ref 8.9–10.3)
Chloride: 97 mmol/L — ABNORMAL LOW (ref 98–111)
Creatinine, Ser: 0.91 mg/dL (ref 0.44–1.00)
GFR, Estimated: >60 mL/min (ref >60)
Glucose, Bld: 249 mg/dL — ABNORMAL HIGH (ref 70–99)
Potassium: 4 mmol/L (ref 3.5–5.1)
Sodium: 133 mmol/L — ABNORMAL LOW (ref 135–145)
Total Bilirubin: 1.4 mg/dL — ABNORMAL HIGH (ref 0.3–1.2)
Total Protein: 8.3 g/dL — ABNORMAL HIGH (ref 6.5–8.1)

## 2021-08-28 LAB — LIPASE, BLOOD: Lipase: 29 U/L (ref 11–51)

## 2021-08-28 LAB — POC URINE PREG, ED: Preg Test, Ur: NEGATIVE

## 2021-08-28 MED ORDER — ONDANSETRON 4 MG PO TBDP: 4.0000 mg | ORAL_TABLET | Freq: Three times a day (TID) | ORAL | 0 refills | Status: DC | PRN

## 2021-08-28 NOTE — ED Provider Triage Note (Signed)
Emergency Medicine Provider Triage Evaluation Note  Rebecca Henry , a 56 y.o. female  was evaluated in triage.  Pt complains of RUQ abdominal pain. She noted onset in the early morning hours. She had associated symptoms of N/V, dizziness, and weakness. Symptoms have been intermittent over the last week, and worsened sharply overnight.   Review of Systems  Positive: RUQ abd pain Negative: diarrhea  Physical Exam  BP (!) 147/96 (BP Location: Left Arm)    Pulse 100    Temp 98.6 F (37 C) (Oral)    Resp 18    Ht 5' 2.75" (1.594 m)    Wt 111.1 kg    SpO2 96%    BMI 43.73 kg/m  Gen:   Awake, no distress   Resp:  Normal effort  MSK:   Moves extremities without difficulty  Other:  ABD: soft, mildly tender to palp RUQ  Medical Decision Making  Medically screening exam initiated at 6:31 PM.  Appropriate orders placed.  Rebecca Henry was informed that the remainder of the evaluation will be completed by another provider, this initial triage assessment does not replace that evaluation, and the importance of remaining in the ED until their evaluation is complete.  Patient with ED evaluation of intermittent RUQ abdominal pain over the last week.    Melvenia Needles, PA-C 08/28/21 1836

## 2021-08-28 NOTE — ED Triage Notes (Signed)
First Nurse Note:  Arrives from [facility] for evaluation for RUQ pain x 1 week, worsening last night.  Last nights episode included emesis.

## 2021-09-04 ENCOUNTER — Other Ambulatory Visit: Payer: BC Managed Care – PPO | Admitting: Physician Assistant

## 2021-09-06 NOTE — ED Provider Notes (Signed)
[facility] Provider Note    Event Date/Time   First MD Initiated Contact with Patient 08/28/21 1928     (approximate)   History   Abdominal Pain   HPI  Rebecca Henry is a 56 y.o. female with history of diabetes who presents with complaints of abdominal pain.  Patient describes the pain as right upper quad abdominal pain, started overnight.  She is having nausea and vomiting as well as weakness.  No diarrhea reported.     Physical Exam   Triage Vital Signs: ED Triage Vitals  Enc Vitals Group     BP 08/28/21 1824 (!) 147/96     Pulse Rate 08/28/21 1824 100     Resp 08/28/21 1824 18     Temp 08/28/21 1824 98.6 F (37 C)     Temp Source 08/28/21 1824 Oral     SpO2 08/28/21 1824 96 %     Weight 08/28/21 1750 111.1 kg (244 lb 14.9 oz)     Height 08/28/21 1750 1.594 m (5' 2.75")     Head Circumference --      Peak Flow --      Pain Score 08/28/21 1750 0     Pain Loc --      Pain Edu? --      Excl. in GC? --     Most recent vital signs: Vitals:   08/28/21 1824 08/28/21 2011  BP: (!) 147/96 133/78  Pulse: 100 95  Resp: 18 17  Temp: 98.6 F (37 C)   SpO2: 96% 95%     General: Awake, no distress.  CV:  Good peripheral perfusion.  Resp:  Normal effort.  Abd:  No distention.  Mild tenderness in the right upper quadrant Other:     ED Results / Procedures / Treatments   Labs (all labs ordered are listed, but only abnormal results are displayed) Labs Reviewed  COMPREHENSIVE METABOLIC PANEL - Abnormal; Notable for the following components:      Result Value   Sodium 133 (*)    Chloride 97 (*)    Glucose, Bld 249 (*)    Total Protein 8.3 (*)    AST 44 (*)    Total Bilirubin 1.4 (*)    All other components within normal limits  CBC - Abnormal; Notable for the following components:   RBC 5.21 (*)    Hemoglobin 15.9 (*)    HCT 46.5 (*)    All other components within normal limits  URINALYSIS, ROUTINE W REFLEX MICROSCOPIC -  Abnormal; Notable for the following components:   APPearance CLEAR (*)    Bilirubin Urine SMALL (*)    Ketones, ur TRACE (*)    Protein, ur 100 (*)    Bacteria, UA RARE (*)    All other components within normal limits  POC URINE PREG, ED - Normal  LIPASE, BLOOD     EKG     RADIOLOGY Right upper quadrant ultrasound reviewed by me, no acute abnormality noted    PROCEDURES:  Critical Care performed:   Procedures   MEDICATIONS ORDERED IN ED: Medications - No data to display   IMPRESSION / MDM / ASSESSMENT AND PLAN / ED COURSE  I reviewed the triage vital signs and the nursing notes.  Patient presents with upper abdominal discomfort as detailed above.  Differential includes gastritis, cholelithiasis, pancreatitis.  Exam is overall reassuring, seems to be improving per patient.  Lab work demonstrates normal white blood cell count, normal lipase  and normal BMP with mildly elevated glucose  Ultrasound of the right upper quadrant performed which demonstrates hepatic steatosis, described this to the patient, no evidence of cholecystitis.  Given the patient is feeling improved but is concerned about nausea returning will Rx ODT Zofran, outpatient follow-up recommended, return precautions discussed.          FINAL CLINICAL IMPRESSION(S) / ED DIAGNOSES   Final diagnoses:  RUQ abdominal pain  Acute gastritis without hemorrhage, unspecified gastritis type     Rx / DC Orders   ED Discharge Orders          Ordered    ondansetron (ZOFRAN-ODT) 4 MG disintegrating tablet  Every 8 hours PRN        08/28/21 2003             Note:  This document was prepared using Dragon voice recognition software and may include unintentional dictation errors.   Jene Every, MD 09/06/21 [phone]

## 2021-09-11 ENCOUNTER — Other Ambulatory Visit: Payer: BC Managed Care – PPO | Admitting: Physician Assistant

## 2021-09-19 ENCOUNTER — Encounter: Payer: Self-pay | Admitting: Physician Assistant

## 2021-11-02 ENCOUNTER — Other Ambulatory Visit: Payer: Self-pay | Admitting: Nurse Practitioner

## 2021-11-02 DIAGNOSIS — E1165 Type 2 diabetes mellitus with hyperglycemia: Secondary | ICD-10-CM

## 2021-11-05 DIAGNOSIS — Z03818 Encounter for observation for suspected exposure to other biological agents ruled out: Secondary | ICD-10-CM | POA: Diagnosis not present

## 2021-11-05 DIAGNOSIS — J069 Acute upper respiratory infection, unspecified: Secondary | ICD-10-CM | POA: Diagnosis not present

## 2021-11-19 IMAGING — MG DIGITAL SCREENING BILAT W/ TOMO W/ CAD
8 series · 8 of 24 positions shown · non-contrast
Comparison: Previous exam(s).

CLINICAL DATA: Screening.

EXAM:
DIGITAL SCREENING BILATERAL MAMMOGRAM WITH TOMO AND CAD

[L MLO synth-2D]
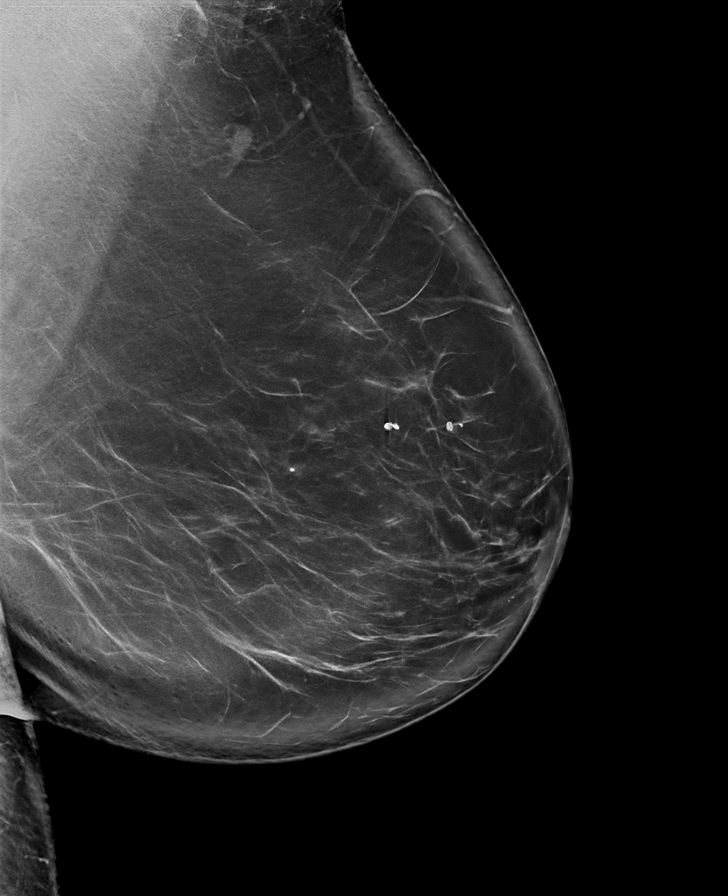

[R MLO synth-2D]
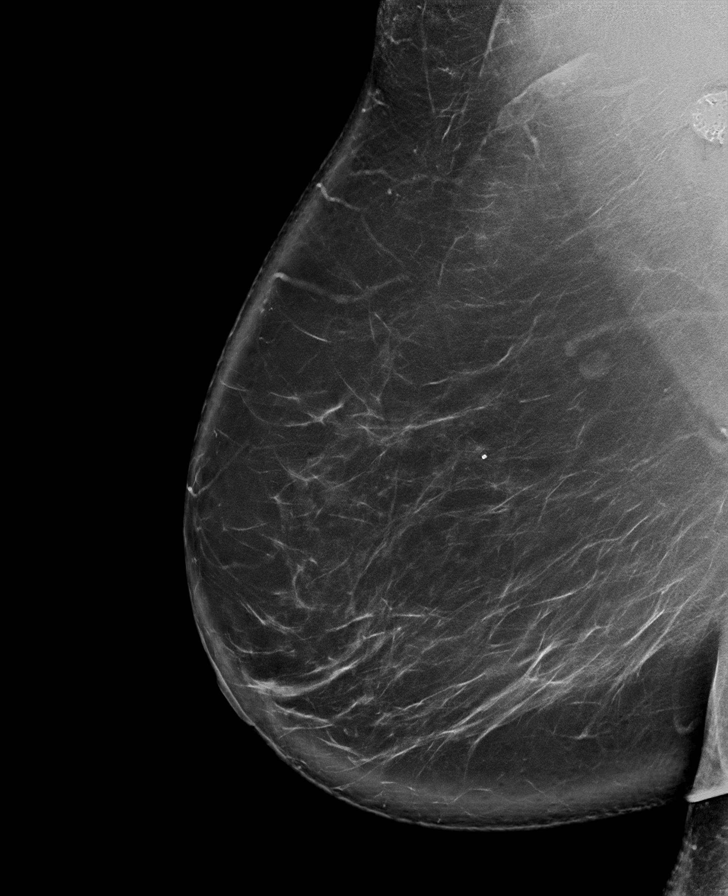

[R CC synth-2D]
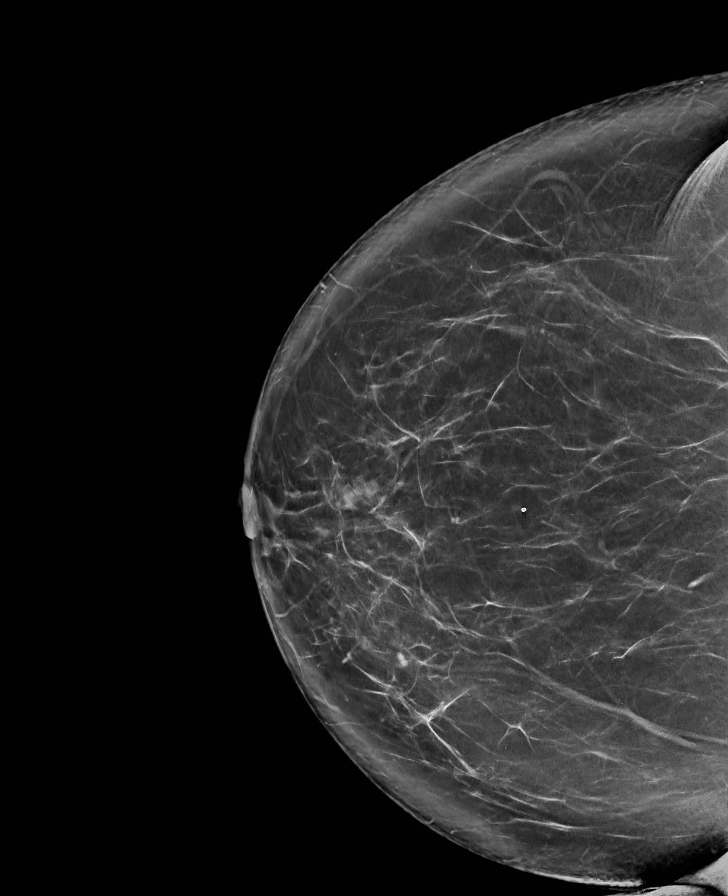

[L CC synth-2D]
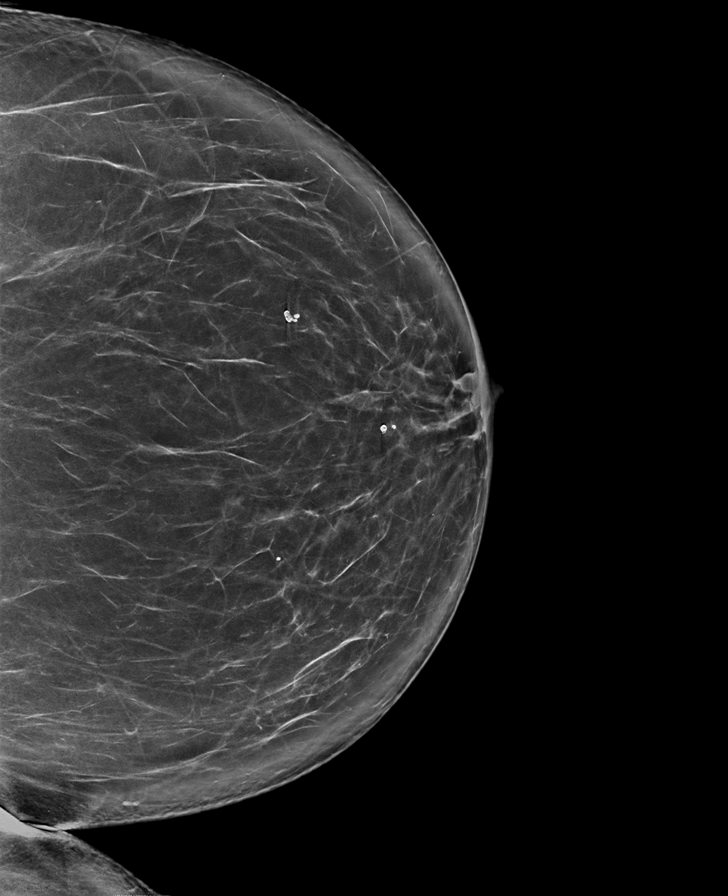

[L MLO tomo · tomo slice 61/122.0]
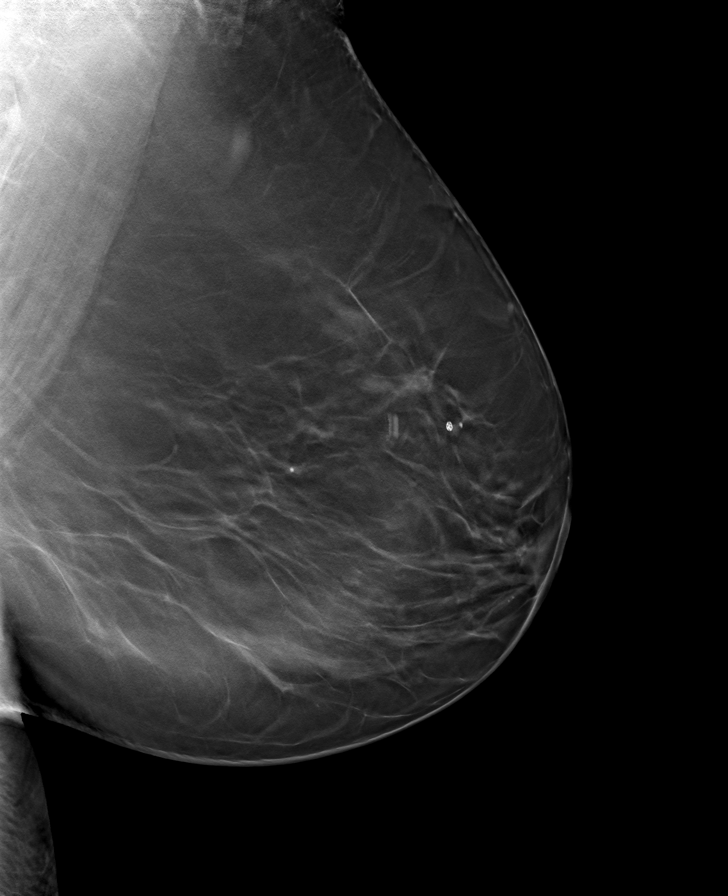

[L CC tomo · tomo slice 49/96.0]
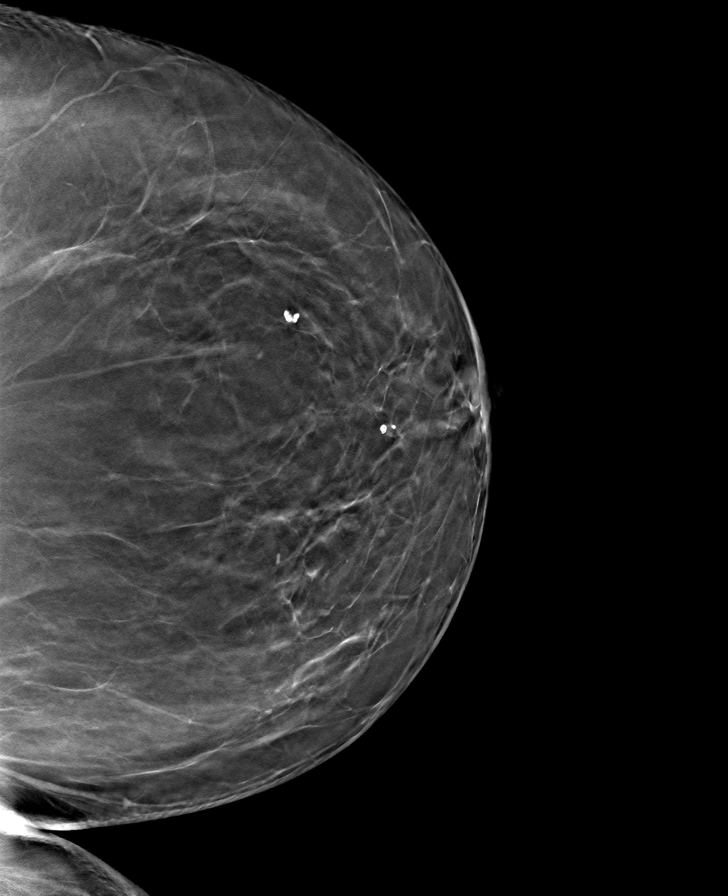

[R MLO tomo · tomo slice 62/123.0]
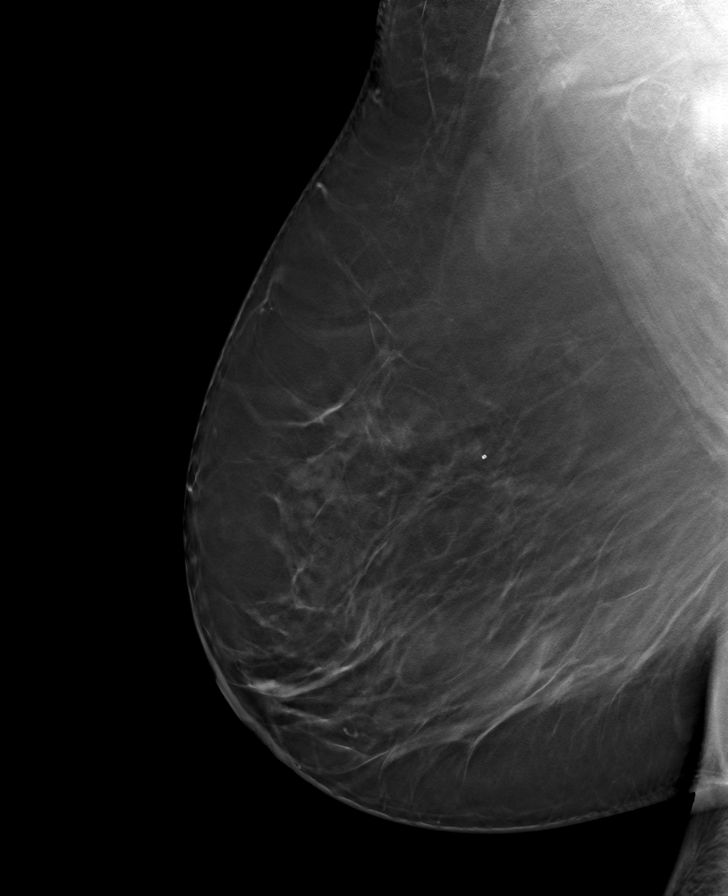

[R CC tomo · tomo slice 49/98.0]
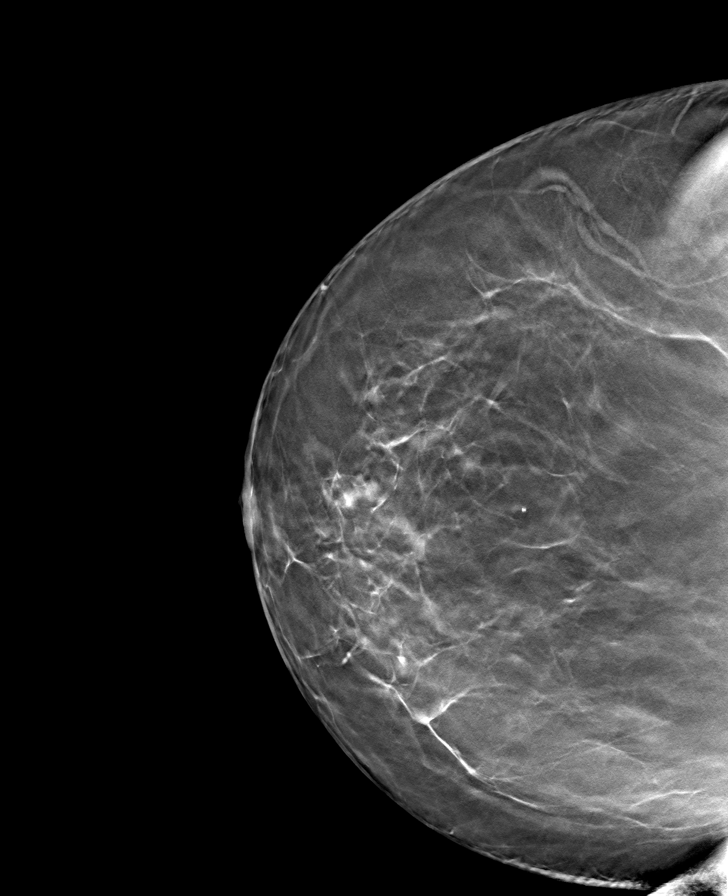

[8 of 24 positions shown; findings below may reference images not displayed]

ACR Breast Density Category b: There are scattered areas of
fibroglandular density.
FINDINGS: There are no findings suspicious for malignancy. Images were
processed with CAD.
IMPRESSION: No mammographic evidence of malignancy. A result letter of this
screening mammogram will be mailed directly to the patient.

RECOMMENDATION:
Screening mammogram in one year. (Code:[id number])

BI-RADS CATEGORY  1: Negative.

## 2021-12-01 ENCOUNTER — Other Ambulatory Visit: Payer: Self-pay | Admitting: Physician Assistant

## 2021-12-01 ENCOUNTER — Other Ambulatory Visit: Payer: Self-pay | Admitting: Nurse Practitioner

## 2021-12-01 DIAGNOSIS — R399 Unspecified symptoms and signs involving the genitourinary system: Secondary | ICD-10-CM | POA: Diagnosis not present

## 2021-12-01 DIAGNOSIS — Z8639 Personal history of other endocrine, nutritional and metabolic disease: Secondary | ICD-10-CM | POA: Diagnosis not present

## 2021-12-01 DIAGNOSIS — E1165 Type 2 diabetes mellitus with hyperglycemia: Secondary | ICD-10-CM

## 2021-12-01 DIAGNOSIS — I10 Essential (primary) hypertension: Secondary | ICD-10-CM

## 2021-12-01 DIAGNOSIS — N390 Urinary tract infection, site not specified: Secondary | ICD-10-CM | POA: Diagnosis not present

## 2021-12-04 ENCOUNTER — Telehealth: Payer: Self-pay

## 2021-12-04 NOTE — Telephone Encounter (Signed)
Spoke with pt that she need appt for refills xanax denied  pt will call back and make appt due to her job we can send refill for routine medication until her next appt  ?

## 2022-01-02 ENCOUNTER — Other Ambulatory Visit: Payer: Self-pay | Admitting: Nurse Practitioner

## 2022-01-02 ENCOUNTER — Other Ambulatory Visit: Payer: Self-pay | Admitting: Physician Assistant

## 2022-01-02 DIAGNOSIS — E1165 Type 2 diabetes mellitus with hyperglycemia: Secondary | ICD-10-CM

## 2022-01-02 DIAGNOSIS — I10 Essential (primary) hypertension: Secondary | ICD-10-CM

## 2022-01-04 ENCOUNTER — Encounter: Payer: Self-pay | Admitting: Nurse Practitioner

## 2022-01-04 ENCOUNTER — Ambulatory Visit: Payer: BC Managed Care – PPO | Admitting: Nurse Practitioner

## 2022-01-04 VITALS — BP 140/70 | HR 67 | Temp 97.8°F | Resp 16 | Ht 62.0 in | Wt 238.0 lb

## 2022-01-04 DIAGNOSIS — I10 Essential (primary) hypertension: Secondary | ICD-10-CM

## 2022-01-04 DIAGNOSIS — F411 Generalized anxiety disorder: Secondary | ICD-10-CM | POA: Diagnosis not present

## 2022-01-04 DIAGNOSIS — Z6841 Body Mass Index (BMI) 40.0 and over, adult: Secondary | ICD-10-CM

## 2022-01-04 DIAGNOSIS — E1165 Type 2 diabetes mellitus with hyperglycemia: Secondary | ICD-10-CM | POA: Diagnosis not present

## 2022-01-04 DIAGNOSIS — F410 Panic disorder [episodic paroxysmal anxiety] without agoraphobia: Secondary | ICD-10-CM

## 2022-01-04 LAB — POCT GLYCOSYLATED HEMOGLOBIN (HGB A1C): Hemoglobin A1C: 9 % — AB (ref 4.0–5.6)

## 2022-01-04 MED ORDER — TIRZEPATIDE 5 MG/0.5ML ~~LOC~~ SOAJ: 5.0000 mg | SUBCUTANEOUS | 1 refills | Status: DC

## 2022-01-04 NOTE — Progress Notes (Signed)
[facility] [place], [place] [address]  Internal MEDICINE  Office Visit Note  Patient Name: Rebecca Henry  [id number]  [id number]  Date of Service: 02/21/2022  Chief Complaint  Patient presents with   Follow-up   Diabetes   Hyperlipidemia   Anxiety   Quality Metric Gaps    Need AWV    HPI Rebecca Henry presents for a follow up visit for diabetes, anxiety and hypertension. Her last in-person office visit was November last year. Her A1C is 9.0 today which is no change from her A1C in November. In the past, she has tried farxiga, trulicity, glimepiride, metformin and metformin XR. She has had issues with each medication for different reasons, usually adverse side effects. She is a good candidate for trying mounjaro since the side effect profile is seems to be better in comparison to other GLP-1 injectables. Rebecca Henry also has a potential for weight loss which is also a concern for the patient. Rebecca Henry has also been shown to decrease sugar cravings.  Her blood pressure is stable and controlled with current medication.   Current Medication: Outpatient Encounter Medications as of 01/04/2022  Medication Sig   acetaminophen (TYLENOL) 500 MG tablet Take 500 mg by mouth every 6 (six) hours as needed.   ALPRAZolam (XANAX) 0.25 MG tablet Take 1 tablet (0.25 mg total) by mouth at bedtime as needed for anxiety (weeknights after 6pm).   atorvastatin (LIPITOR) 10 MG tablet Take 1 tablet (10 mg total) by mouth daily.   blood glucose meter kit and supplies KIT Dispense based on patient and insurance preference. Use up to four times daily as directed. (FOR ICD-9 250.00, 250.01).   cetirizine (ZYRTEC) 5 MG tablet Take 5 mg by mouth daily.   chlorpheniramine (CHLOR-TRIMETON) 4 MG tablet Take 4 mg by mouth daily.   Continuous Blood Gluc Receiver (FREESTYLE LIBRE 2 READER) DEVI Use every 14 days to check blood sugars E11.65   Continuous Blood Gluc Sensor (FREESTYLE LIBRE 2 SENSOR) MISC  Use every 14 days to check blood sugars E11.65   cyclobenzaprine (FLEXERIL) 5 MG tablet Take 1 tablet (5 mg total) by mouth 2 (two) times daily as needed.   glimepiride (AMARYL) 1 MG tablet Take 1 tablet (1 mg total) by mouth daily with breakfast.   glucose blood (CONTOUR NEXT TEST) test strip Blood sugar testing testing three times daily  DX E11.65   ibuprofen (ADVIL) 200 MG tablet Take 200 mg by mouth every 6 (six) hours as needed. Takes as needed   JANUMET 50-500 MG tablet Take 1 tablet by mouth daily.   loratadine (CLARITIN) 10 MG tablet Take 10 mg by mouth daily as needed.    meloxicam (MOBIC) 15 MG tablet Take 1 tablet (15 mg total) by mouth daily.   ondansetron (ZOFRAN-ODT) 4 MG disintegrating tablet Take 1 tablet (4 mg total) by mouth every 8 (eight) hours as needed for nausea or vomiting.   tirzepatide (MOUNJARO) 5 MG/0.5ML Pen Inject 5 mg into the skin once a week.   [DISCONTINUED] atenolol (TENORMIN) 25 MG tablet Take one tablet by mouth daily   [DISCONTINUED] amoxicillin-clavulanate (AUGMENTIN) 875-125 MG tablet Take 1 tablet by mouth 2 (two) times daily. Take with food   No facility-administered encounter medications on file as of 01/04/2022.    Surgical History: Past Surgical History:  Procedure Laterality Date   CESAREAN SECTION     COLONOSCOPY WITH PROPOFOL N/A 06/02/2019   Procedure: COLONOSCOPY WITH PROPOFOL;  Surgeon: Lucilla Lame, MD;  Location: [facility] ENDOSCOPY;  Service: Endoscopy;  Laterality: N/A;    Medical History: Past Medical History:  Diagnosis Date   Allergic rhinitis    Anxiety    Diabetes mellitus without complication ([place])    Hyperlipidemia    Sleep apnea    Patient states she doesn't have it but it was in history. Did sleep study years ago    Family History: Family History  Problem Relation Age of Onset   Hypertension Mother    Diabetes Maternal Grandmother    Hypothyroidism Maternal Grandmother    Breast cancer Neg Hx     Social History    Socioeconomic History   Marital status: Single    Spouse name: Not on file   Number of children: Not on file   Years of education: Not on file   Highest education level: Not on file  Occupational History   Not on file  Tobacco Use   Smoking status: Never   Smokeless tobacco: Never  Substance and Sexual Activity   Alcohol use: Not Currently   Drug use: No   Sexual activity: Not on file  Other Topics Concern   Not on file  Social History Narrative   Not on file   Social Determinants of Health   Financial Resource Strain: Not on file  Food Insecurity: Not on file  Transportation Needs: Not on file  Physical Activity: Not on file  Stress: Not on file  Social Connections: Not on file  Intimate Partner Violence: Not on file      Review of Systems  Vital Signs: BP 140/70   Pulse 67   Temp 97.8 F (36.6 C)   Resp 16   Ht _0  (1.575 m)   Wt 238 lb (108 kg)   SpO2 98%   BMI 43.53 kg/m    Physical Exam Vitals reviewed.  Constitutional:      General: She is not in acute distress.    Appearance: Normal appearance. She is obese. She is not ill-appearing.  HENT:     Head: Atraumatic.  Eyes:     Pupils: Pupils are equal, round, and reactive to light.  Cardiovascular:     Rate and Rhythm: Normal rate and regular rhythm.  Pulmonary:     Effort: Pulmonary effort is normal. No respiratory distress.  Neurological:     Mental Status: She is alert and oriented to person, place, and time.  Psychiatric:        Mood and Affect: Mood normal.        Behavior: Behavior normal.        Assessment/Plan: 1. Type 2 diabetes mellitus with hyperglycemia, without long-term current use of insulin (HCC) A1C still elevated at 9.0, 4 week sample of mounjaro provided to patient. Increased step up dose prescribed and sent to pharmacy. Patient has tried several medications already but has had issues with each one.  - POCT HgB A1C - tirzepatide (MOUNJARO) 5 MG/0.5ML Pen; Inject 5  mg into the skin once a week.  Dispense: 6 mL; Refill: 1  2. Essential hypertension Controlled with current medication.   3. Class 3 severe obesity due to excess calories with serious comorbidity and body mass index (BMI) of 40.0 to 44.9 in adult [facility]) Mounjaro has the potential to decrease sugar cravings and have increased amount of weight loss compared to other GLP-1 injectables.  - tirzepatide [facility]) 5 MG/0.5ML Pen; Inject 5 mg into the skin once a week.  Dispense: 6 mL; Refill: 1  4.  Generalized anxiety disorder with panic attacks Patient has baseline anxiety but has increased anxiety regarding trying new medications especially with the issues she has had in the past with other diabetic medications. We discussed the medication in detail and reviewed the side effects and possible complications together and discussed them, she voiced her concerns and I answered all of her questions to the best of my ability.    General Counseling: jonette wassel understanding of the findings of todays visit and agrees with plan of treatment. I have discussed any further diagnostic evaluation that may be needed or ordered today. We also reviewed her medications today. she has been encouraged to call the office with any questions or concerns that should arise related to todays visit.    Orders Placed This Encounter  Procedures   POCT HgB A1C    Meds ordered this encounter  Medications   tirzepatide (MOUNJARO) 5 MG/0.5ML Pen    Sig: Inject 5 mg into the skin once a week.    Dispense:  6 mL    Refill:  1    Return in about 1 month (around 02/03/2022) for F/U, eval new med, [place] PCP.   Total time spent:30 Minutes Time spent includes review of chart, medications, test results, and follow up plan with the patient.   [place] Controlled Substance Database was reviewed by me.  This patient was seen by Jonetta Osgood, FNP-C in collaboration with Dr. Clayborn Bigness as a part of collaborative care  agreement.   Rolla Servidio R. Valetta Fuller, MSN, FNP-C Internal medicine

## 2022-01-30 ENCOUNTER — Other Ambulatory Visit: Payer: BC Managed Care – PPO | Admitting: Nurse Practitioner

## 2022-02-02 ENCOUNTER — Other Ambulatory Visit: Payer: Self-pay | Admitting: Nurse Practitioner

## 2022-02-02 DIAGNOSIS — I10 Essential (primary) hypertension: Secondary | ICD-10-CM

## 2022-02-05 ENCOUNTER — Other Ambulatory Visit: Payer: BC Managed Care – PPO | Admitting: Nurse Practitioner

## 2022-02-15 ENCOUNTER — Telehealth: Payer: Self-pay

## 2022-02-15 MED ORDER — TIRZEPATIDE 2.5 MG/0.5ML ~~LOC~~ SOAJ: 2.5000 mg | SUBCUTANEOUS | 0 refills | Status: DC

## 2022-02-16 NOTE — Telephone Encounter (Signed)
Pt advised that we change dose for [facility] and send

## 2022-02-21 ENCOUNTER — Encounter: Payer: Self-pay | Admitting: Nurse Practitioner

## 2022-02-26 DIAGNOSIS — L853 Xerosis cutis: Secondary | ICD-10-CM | POA: Diagnosis not present

## 2022-02-26 DIAGNOSIS — L281 Prurigo nodularis: Secondary | ICD-10-CM | POA: Diagnosis not present

## 2022-02-26 DIAGNOSIS — L821 Other seborrheic keratosis: Secondary | ICD-10-CM | POA: Diagnosis not present

## 2022-03-05 ENCOUNTER — Other Ambulatory Visit: Payer: BC Managed Care – PPO | Admitting: Nurse Practitioner

## 2022-03-07 ENCOUNTER — Other Ambulatory Visit: Payer: Self-pay | Admitting: Nurse Practitioner

## 2022-03-07 DIAGNOSIS — I10 Essential (primary) hypertension: Secondary | ICD-10-CM

## 2022-04-23 ENCOUNTER — Telehealth: Payer: Self-pay | Admitting: Nurse Practitioner

## 2022-04-23 NOTE — Telephone Encounter (Signed)
Left vm to confirm 04/30/22 appointment-Toni 

## 2022-04-30 ENCOUNTER — Encounter: Payer: Self-pay | Admitting: Nurse Practitioner

## 2022-04-30 ENCOUNTER — Ambulatory Visit (INDEPENDENT_AMBULATORY_CARE_PROVIDER_SITE_OTHER): Payer: BC Managed Care – PPO | Admitting: Nurse Practitioner

## 2022-04-30 VITALS — BP 134/70 | HR 83 | Temp 97.8°F | Resp 16 | Ht 62.0 in | Wt 229.4 lb

## 2022-04-30 DIAGNOSIS — Z0001 Encounter for general adult medical examination with abnormal findings: Secondary | ICD-10-CM

## 2022-04-30 DIAGNOSIS — E538 Deficiency of other specified B group vitamins: Secondary | ICD-10-CM | POA: Diagnosis not present

## 2022-04-30 DIAGNOSIS — Z760 Encounter for issue of repeat prescription: Secondary | ICD-10-CM

## 2022-04-30 DIAGNOSIS — E559 Vitamin D deficiency, unspecified: Secondary | ICD-10-CM | POA: Diagnosis not present

## 2022-04-30 DIAGNOSIS — Z1231 Encounter for screening mammogram for malignant neoplasm of breast: Secondary | ICD-10-CM

## 2022-04-30 DIAGNOSIS — E1165 Type 2 diabetes mellitus with hyperglycemia: Secondary | ICD-10-CM | POA: Diagnosis not present

## 2022-04-30 DIAGNOSIS — D529 Folate deficiency anemia, unspecified: Secondary | ICD-10-CM | POA: Diagnosis not present

## 2022-04-30 DIAGNOSIS — E782 Mixed hyperlipidemia: Secondary | ICD-10-CM

## 2022-04-30 DIAGNOSIS — R300 Dysuria: Secondary | ICD-10-CM

## 2022-04-30 DIAGNOSIS — Z6841 Body Mass Index (BMI) 40.0 and over, adult: Secondary | ICD-10-CM

## 2022-04-30 LAB — POCT GLYCOSYLATED HEMOGLOBIN (HGB A1C): Hemoglobin A1C: 6.5 % — AB (ref 4.0–5.6)

## 2022-04-30 MED ORDER — ATORVASTATIN CALCIUM 10 MG PO TABS: 10.0000 mg | ORAL_TABLET | Freq: Every day | ORAL | 11 refills | Status: DC

## 2022-04-30 MED ORDER — ATENOLOL 25 MG PO TABS: 25.0000 mg | ORAL_TABLET | Freq: Every day | ORAL | 5 refills | Status: DC

## 2022-04-30 MED ORDER — ALPRAZOLAM 0.25 MG PO TABS: 0.2500 mg | ORAL_TABLET | Freq: Every evening | ORAL | 0 refills | Status: DC | PRN

## 2022-04-30 MED ORDER — MELOXICAM 15 MG PO TABS: 15.0000 mg | ORAL_TABLET | Freq: Every day | ORAL | 5 refills | Status: DC

## 2022-04-30 MED ORDER — CONTOUR NEXT TEST VI STRP: ORAL_STRIP | 11 refills | Status: DC

## 2022-04-30 MED ORDER — CYCLOBENZAPRINE HCL 5 MG PO TABS: 5.0000 mg | ORAL_TABLET | Freq: Two times a day (BID) | ORAL | 2 refills | Status: DC | PRN

## 2022-04-30 MED ORDER — TIRZEPATIDE 5 MG/0.5ML ~~LOC~~ SOAJ: 5.0000 mg | SUBCUTANEOUS | 5 refills | Status: DC

## 2022-04-30 NOTE — Patient Instructions (Signed)
Acetaminophen/tylenol 325 to 650 mg PO every 4 to 6 hours, as needed. Alternatively, 1,000 mg PO every 6 hours as needed. Max single dose: 1,000 mg/dose. Max daily dose: 4,000 mg/day.

## 2022-04-30 NOTE — Progress Notes (Signed)
[facility] [place], [place] [address]  Internal MEDICINE  Office Visit Note  Patient Name: Rebecca Henry  [id number]  [id number]  Date of Service: 04/30/2022  Chief Complaint  Patient presents with   Annual Exam   Hyperlipidemia   Diabetes    HPI Shayleen presents for an annual well visit and physical exam.  Well-appearing 56 year old female with diabetes, hypertension, osteoarthritis, high cholesterol, and GAD. --overdue for mammogram and diabetic eye exam --foot exam today --last pap was normal and neg HPV in 2019. Declined further pap smear but would like HPV testing only next year. --colonoscopy was done in 2020, due in 2030 BP stable with current medications For a1c of 9.0, started on mounjaro in june, is on $Rem'5mg'ENND$  dose now.  A1c is 6.5 today. Down 9 lbs since June. Minimal issues with side effects -- mostly mild nausea and mild fatigue or tiredness for a couple days after injection.      Current Medication: Outpatient Encounter Medications as of 04/30/2022  Medication Sig   acetaminophen (TYLENOL) 500 MG tablet Take 500 mg by mouth every 6 (six) hours as needed.   blood glucose meter kit and supplies KIT Dispense based on patient and insurance preference. Use up to four times daily as directed. (FOR ICD-9 250.00, 250.01).   cetirizine (ZYRTEC) 5 MG tablet Take 5 mg by mouth daily.   chlorpheniramine (CHLOR-TRIMETON) 4 MG tablet Take 4 mg by mouth daily.   ibuprofen (ADVIL) 200 MG tablet Take 200 mg by mouth every 6 (six) hours as needed. Takes as needed   loratadine (CLARITIN) 10 MG tablet Take 10 mg by mouth daily as needed.    [DISCONTINUED] ALPRAZolam (XANAX) 0.25 MG tablet Take 1 tablet (0.25 mg total) by mouth at bedtime as needed for anxiety (weeknights after 6pm).   [DISCONTINUED] atenolol (TENORMIN) 25 MG tablet Take one tablet by mouth daily   [DISCONTINUED] atorvastatin (LIPITOR) 10 MG tablet Take 1 tablet (10 mg total) by mouth daily.    [DISCONTINUED] Continuous Blood Gluc Receiver (FREESTYLE LIBRE 2 READER) DEVI Use every 14 days to check blood sugars E11.65   [DISCONTINUED] Continuous Blood Gluc Sensor (FREESTYLE LIBRE 2 SENSOR) MISC Use every 14 days to check blood sugars E11.65   [DISCONTINUED] cyclobenzaprine (FLEXERIL) 5 MG tablet Take 1 tablet (5 mg total) by mouth 2 (two) times daily as needed.   [DISCONTINUED] glimepiride (AMARYL) 1 MG tablet Take 1 tablet (1 mg total) by mouth daily with breakfast.   [DISCONTINUED] glucose blood (CONTOUR NEXT TEST) test strip Blood sugar testing testing three times daily  DX E11.65   [DISCONTINUED] JANUMET 50-500 MG tablet Take 1 tablet by mouth daily.   [DISCONTINUED] meloxicam (MOBIC) 15 MG tablet Take 1 tablet (15 mg total) by mouth daily.   [DISCONTINUED] ondansetron (ZOFRAN-ODT) 4 MG disintegrating tablet Take 1 tablet (4 mg total) by mouth every 8 (eight) hours as needed for nausea or vomiting.   [DISCONTINUED] tirzepatide [facility]) 2.5 MG/0.5ML Pen Inject 2.5 mg into the skin once a week.   [DISCONTINUED] tirzepatide [facility]) 5 MG/0.5ML Pen Inject 5 mg into the skin once a week.   ALPRAZolam (XANAX) 0.25 MG tablet Take 1 tablet (0.25 mg total) by mouth at bedtime as needed for anxiety (weeknights after 6pm).   atenolol (TENORMIN) 25 MG tablet Take 1 tablet (25 mg total) by mouth daily.   atorvastatin (LIPITOR) 10 MG tablet Take 1 tablet (10 mg total) by mouth daily.   cyclobenzaprine (  FLEXERIL) 5 MG tablet Take 1 tablet (5 mg total) by mouth 2 (two) times daily as needed for muscle spasms.   glucose blood (CONTOUR NEXT TEST) test strip Blood sugar testing testing three times daily  DX E11.65   meloxicam (MOBIC) 15 MG tablet Take 1 tablet (15 mg total) by mouth daily.   tirzepatide [facility]) 5 MG/0.5ML Pen Inject 5 mg into the skin once a week.   No facility-administered encounter medications on file as of 04/30/2022.    Surgical History: Past Surgical History:  Procedure  Laterality Date   CESAREAN SECTION     COLONOSCOPY WITH PROPOFOL N/A 06/02/2019   Procedure: COLONOSCOPY WITH PROPOFOL;  Surgeon: Lucilla Lame, MD;  Location: [facility] ENDOSCOPY;  Service: Endoscopy;  Laterality: N/A;    Medical History: Past Medical History:  Diagnosis Date   Allergic rhinitis    Anxiety    Diabetes mellitus without complication ([place])    Hyperlipidemia    Sleep apnea    Patient states she doesn't have it but it was in history. Did sleep study years ago    Family History: Family History  Problem Relation Age of Onset   Hypertension Mother    Diabetes Maternal Grandmother    Hypothyroidism Maternal Grandmother    Breast cancer Neg Hx     Social History   Socioeconomic History   Marital status: Single    Spouse name: Not on file   Number of children: Not on file   Years of education: Not on file   Highest education level: Not on file  Occupational History   Not on file  Tobacco Use   Smoking status: Never   Smokeless tobacco: Never  Substance and Sexual Activity   Alcohol use: Not Currently   Drug use: No   Sexual activity: Not on file  Other Topics Concern   Not on file  Social History Narrative   Not on file   Social Determinants of Health   Financial Resource Strain: Not on file  Food Insecurity: Not on file  Transportation Needs: Not on file  Physical Activity: Not on file  Stress: Not on file  Social Connections: Not on file  Intimate Partner Violence: Not on file      Review of Systems  Constitutional:  Negative for activity change, appetite change, chills, fatigue, fever and unexpected weight change.  HENT: Negative.  Negative for congestion, ear pain, rhinorrhea, sore throat and trouble swallowing.   Eyes: Negative.   Respiratory: Negative.  Negative for cough, chest tightness, shortness of breath and wheezing.   Cardiovascular: Negative.  Negative for chest pain.  Gastrointestinal: Negative.  Negative for abdominal pain, blood in  stool, constipation, diarrhea, nausea and vomiting.  Endocrine: Negative.   Genitourinary: Negative.  Negative for difficulty urinating, dysuria, frequency, hematuria and urgency.  Musculoskeletal: Negative.  Negative for arthralgias, back pain, joint swelling, myalgias and neck pain.  Skin: Negative.  Negative for rash and wound.  Allergic/Immunologic: Negative.  Negative for immunocompromised state.  Neurological: Negative.  Negative for dizziness, seizures, numbness and headaches.  Hematological: Negative.   Psychiatric/Behavioral: Negative.  Negative for behavioral problems, self-injury and suicidal ideas. The patient is not nervous/anxious.     Vital Signs: BP 134/70   Pulse 83   Temp 97.8 F (36.6 C)   Resp 16   Ht $R'5\' 2"'Bd$  (1.575 m)   Wt 229 lb 6.4 oz (104.1 kg)   SpO2 98%   BMI 41.96 kg/m    Physical Exam Vitals reviewed.  Constitutional:      General: She is not in acute distress.    Appearance: Normal appearance. She is well-developed. She is obese. She is not ill-appearing or diaphoretic.  HENT:     Head: Normocephalic and atraumatic.     Right Ear: Tympanic membrane, ear canal and external ear normal.     Left Ear: Tympanic membrane, ear canal and external ear normal.     Nose: Nose normal. No congestion or rhinorrhea.     Mouth/Throat:     Mouth: Mucous membranes are moist.     Pharynx: Oropharynx is clear. No oropharyngeal exudate or posterior oropharyngeal erythema.  Eyes:     General: No scleral icterus.       Right eye: No discharge.        Left eye: No discharge.     Extraocular Movements: Extraocular movements intact.     Conjunctiva/sclera: Conjunctivae normal.     Pupils: Pupils are equal, round, and reactive to light.  Neck:     Thyroid: No thyromegaly.     Vascular: No JVD.     Trachea: No tracheal deviation.  Cardiovascular:     Rate and Rhythm: Normal rate and regular rhythm.     Pulses:          Dorsalis pedis pulses are 2+ on the right side  and 2+ on the left side.       Posterior tibial pulses are 2+ on the right side and 2+ on the left side.     Heart sounds: Normal heart sounds. No murmur heard.    No friction rub. No gallop.  Pulmonary:     Effort: Pulmonary effort is normal. No respiratory distress.     Breath sounds: Normal breath sounds. No stridor. No wheezing or rales.  Chest:     Chest wall: No tenderness.  Abdominal:     General: Bowel sounds are normal. There is no distension.     Palpations: Abdomen is soft. There is no mass.     Tenderness: There is no abdominal tenderness. There is no guarding or rebound.  Musculoskeletal:        General: No tenderness or deformity. Normal range of motion.     Cervical back: Normal range of motion and neck supple.     Right foot: Normal range of motion. No deformity, bunion, Charcot foot, foot drop or prominent metatarsal heads.     Left foot: Normal range of motion. No deformity, bunion, Charcot foot, foot drop or prominent metatarsal heads.  Feet:     Right foot:     Protective Sensation: 6 sites tested.  6 sites sensed.     Skin integrity: Dry skin present. No ulcer, blister, skin breakdown, erythema, warmth, callus or fissure.     Toenail Condition: Right toenails are abnormally thick and long.     Left foot:     Protective Sensation: 6 sites tested.  6 sites sensed.     Skin integrity: Dry skin present. No ulcer, blister, skin breakdown, erythema, warmth, callus or fissure.     Toenail Condition: Left toenails are abnormally thick and long.  Lymphadenopathy:     Cervical: No cervical adenopathy.  Skin:    General: Skin is warm and dry.     Capillary Refill: Capillary refill takes less than 2 seconds.     Coloration: Skin is not pale.     Findings: No erythema or rash.  Neurological:     Mental Status: She is alert.  Cranial Nerves: No cranial nerve deficit.     Motor: No abnormal muscle tone.     Coordination: Coordination normal.     Deep Tendon Reflexes:  Reflexes are normal and symmetric.  Psychiatric:        Behavior: Behavior normal.        Thought Content: Thought content normal.        Judgment: Judgment normal.       Assessment/Plan: 1. Encounter for general adult medical examination with abnormal findings Age-appropriate preventive screenings and vaccinations discussed, annual physical exam completed. Routine labs for health maintenance ordered, see below. PHM updated.  - CBC with Differential/Platelet - CMP14+EGFR - Lipid Profile - Vitamin D (25 hydroxy) - TSH + free T4 - B12 and Folate Panel  2. Type 2 diabetes mellitus with hyperglycemia, without long-term current use of insulin (HCC) A1C significantly improved from prior, repeat a1c in 3 months to assess for improvement.  - POCT glycosylated hemoglobin (Hb A1C)  3. Mixed hyperlipidemia Routine labs ordered - CBC with Differential/Platelet - Lipid Profile - TSH + free T4  4. Vitamin D deficiency Routine lab ordered - Vitamin D (25 hydroxy)  5. B12 deficiency Routine lab ordered - B12 and Folate Panel  6. Dysuria Routine urinalysis done - UA/M w/rflx Culture, Routine  7. Class 3 severe obesity due to excess calories with serious comorbidity and body mass index (BMI) of 40.0 to 44.9 in adult [facility]) Routine labs ordered; lost 9 lbs since last visit.  - CBC with Differential/Platelet - CMP14+EGFR - Lipid Profile - TSH + free T4  8. Encounter for screening mammogram for malignant neoplasm of breast Routine mammogram ordered, patient will call [place] to schedule it - MM 3D SCREEN BREAST BILATERAL; Future  9. Medication refill - tirzepatide (MOUNJARO) 5 MG/0.5ML Pen; Inject 5 mg into the skin once a week.  Dispense: 2 mL; Refill: 5 - atorvastatin (LIPITOR) 10 MG tablet; Take 1 tablet (10 mg total) by mouth daily.  Dispense: 30 tablet; Refill: 11 - ALPRAZolam (XANAX) 0.25 MG tablet; Take 1 tablet (0.25 mg total) by mouth at bedtime as needed for anxiety  (weeknights after 6pm).  Dispense: 30 tablet; Refill: 0 - atenolol (TENORMIN) 25 MG tablet; Take 1 tablet (25 mg total) by mouth daily.  Dispense: 30 tablet; Refill: 5 - meloxicam (MOBIC) 15 MG tablet; Take 1 tablet (15 mg total) by mouth daily.  Dispense: 30 tablet; Refill: 5 - cyclobenzaprine (FLEXERIL) 5 MG tablet; Take 1 tablet (5 mg total) by mouth 2 (two) times daily as needed for muscle spasms.  Dispense: 45 tablet; Refill: 2 - glucose blood (CONTOUR NEXT TEST) test strip; Blood sugar testing testing three times daily  DX E11.65  Dispense: 100 each; Refill: 11     General Counseling: dalayla aldredge understanding of the findings of todays visit and agrees with plan of treatment. I have discussed any further diagnostic evaluation that may be needed or ordered today. We also reviewed her medications today. she has been encouraged to call the office with any questions or concerns that should arise related to todays visit.    Orders Placed This Encounter  Procedures   MM 3D SCREEN BREAST BILATERAL   UA/M w/rflx Culture, Routine   CBC with Differential/Platelet   CMP14+EGFR   Lipid Profile   Vitamin D (25 hydroxy)   TSH + free T4   B12 and Folate Panel   POCT glycosylated hemoglobin (Hb A1C)    Meds ordered this encounter  Medications  tirzepatide [facility]) 5 MG/0.5ML Pen    Sig: Inject 5 mg into the skin once a week.    Dispense:  2 mL    Refill:  5   atorvastatin (LIPITOR) 10 MG tablet    Sig: Take 1 tablet (10 mg total) by mouth daily.    Dispense:  30 tablet    Refill:  11   ALPRAZolam (XANAX) 0.25 MG tablet    Sig: Take 1 tablet (0.25 mg total) by mouth at bedtime as needed for anxiety (weeknights after 6pm).    Dispense:  30 tablet    Refill:  0   atenolol (TENORMIN) 25 MG tablet    Sig: Take 1 tablet (25 mg total) by mouth daily.    Dispense:  30 tablet    Refill:  5   meloxicam (MOBIC) 15 MG tablet    Sig: Take 1 tablet (15 mg total) by mouth daily.     Dispense:  30 tablet    Refill:  5    For future refills, do not fill, patient will call   cyclobenzaprine (FLEXERIL) 5 MG tablet    Sig: Take 1 tablet (5 mg total) by mouth 2 (two) times daily as needed for muscle spasms.    Dispense:  45 tablet    Refill:  2    For future refills, patient will call when she needs to fill it   glucose blood (CONTOUR NEXT TEST) test strip    Sig: Blood sugar testing testing three times daily  DX E11.65    Dispense:  100 each    Refill:  11    Return in about 3 months (around 07/30/2022) for F/U, Recheck A1C, Jakhai [place] PCP.   Total time spent:30 Minutes Time spent includes review of chart, medications, test results, and follow up plan with the patient.   [place] Controlled Substance Database was reviewed by me.  This patient was seen by Jonetta Osgood, FNP-C in collaboration with Dr. Clayborn Bigness as a part of collaborative care agreement.  Vedansh Kerstetter R. Valetta Fuller, MSN, FNP-C Internal medicine

## 2022-05-01 LAB — CBC WITH DIFFERENTIAL/PLATELET
Basophils Absolute: 0.1 10*3/uL (ref 0.0–0.2)
Basos: 1 %
EOS (ABSOLUTE): 0.2 10*3/uL (ref 0.0–0.4)
Eos: 2 %
Hematocrit: 43 % (ref 34.0–46.6)
Hemoglobin: 14.7 g/dL (ref 11.1–15.9)
Immature Grans (Abs): 0 10*3/uL (ref 0.0–0.1)
Immature Granulocytes: 0 %
Lymphocytes Absolute: 2.3 10*3/uL (ref 0.7–3.1)
Lymphs: 31 %
MCH: 30.9 pg (ref 26.6–33.0)
MCHC: 34.2 g/dL (ref 31.5–35.7)
MCV: 90 fL (ref 79–97)
Monocytes Absolute: 0.5 10*3/uL (ref 0.1–0.9)
Monocytes: 7 %
Neutrophils Absolute: 4.3 10*3/uL (ref 1.4–7.0)
Neutrophils: 59 %
Platelets: 242 10*3/uL (ref 150–450)
RBC: 4.76 x10E6/uL (ref 3.77–5.28)
RDW: 11.9 % (ref 11.7–15.4)
WBC: 7.3 10*3/uL (ref 3.4–10.8)

## 2022-05-01 LAB — UA/M W/RFLX CULTURE, ROUTINE
Bilirubin, UA: NEGATIVE
Glucose, UA: NEGATIVE
Leukocytes,UA: NEGATIVE
Nitrite, UA: NEGATIVE
RBC, UA: NEGATIVE
Specific Gravity, UA: >=1.03 — AB (ref 1.005–1.030)
Urobilinogen, Ur: 0.2 mg/dL (ref 0.2–1.0)
pH, UA: 5 (ref 5.0–7.5)

## 2022-05-01 LAB — CMP14+EGFR
ALT: 24 IU/L (ref 0–32)
AST: 18 IU/L (ref 0–40)
Albumin/Globulin Ratio: 1.9 (ref 1.2–2.2)
Albumin: 4.7 g/dL (ref 3.8–4.9)
Alkaline Phosphatase: 116 IU/L (ref 44–121)
BUN/Creatinine Ratio: 10 (ref 9–23)
BUN: 9 mg/dL (ref 6–24)
Bilirubin Total: 0.5 mg/dL (ref 0.0–1.2)
CO2: 20 mmol/L (ref 20–29)
Calcium: 9.7 mg/dL (ref 8.7–10.2)
Chloride: 104 mmol/L (ref 96–106)
Creatinine, Ser: 0.91 mg/dL (ref 0.57–1.00)
Globulin, Total: 2.5 g/dL (ref 1.5–4.5)
Glucose: 126 mg/dL — ABNORMAL HIGH (ref 70–99)
Potassium: 4.4 mmol/L (ref 3.5–5.2)
Sodium: 138 mmol/L (ref 134–144)
Total Protein: 7.2 g/dL (ref 6.0–8.5)
eGFR: 75 mL/min/{1.73_m2} (ref >59)

## 2022-05-01 LAB — LIPID PANEL
Chol/HDL Ratio: 3.5 ratio (ref 0.0–4.4)
Cholesterol, Total: 138 mg/dL (ref 100–199)
HDL: 40 mg/dL (ref >39)
LDL Chol Calc (NIH): 75 mg/dL (ref 0–99)
Triglycerides: 130 mg/dL (ref 0–149)
VLDL Cholesterol Cal: 23 mg/dL (ref 5–40)

## 2022-05-01 LAB — B12 AND FOLATE PANEL
Folate: 8.4 ng/mL (ref >3.0)
Vitamin B-12: 290 pg/mL (ref 232–1245)

## 2022-05-01 LAB — MICROSCOPIC EXAMINATION
Casts: NONE SEEN /lpf
Epithelial Cells (non renal): >10 /hpf — AB (ref 0–10)
RBC, Urine: NONE SEEN /hpf (ref 0–2)

## 2022-05-01 LAB — TSH+FREE T4
Free T4: 1.1 ng/dL (ref 0.82–1.77)
TSH: 0.881 u[IU]/mL (ref 0.450–4.500)

## 2022-05-01 LAB — VITAMIN D 25 HYDROXY (VIT D DEFICIENCY, FRACTURES): Vit D, 25-Hydroxy: 18.5 ng/mL — ABNORMAL LOW (ref 30.0–100.0)

## 2022-07-23 ENCOUNTER — Ambulatory Visit: Payer: BC Managed Care – PPO | Admitting: Nurse Practitioner

## 2022-09-18 ENCOUNTER — Encounter: Payer: Self-pay | Admitting: Nurse Practitioner

## 2022-09-18 ENCOUNTER — Ambulatory Visit (INDEPENDENT_AMBULATORY_CARE_PROVIDER_SITE_OTHER): Payer: BC Managed Care – PPO | Admitting: Nurse Practitioner

## 2022-09-18 VITALS — BP 122/70 | HR 90 | Temp 98.3°F | Resp 16 | Ht 62.0 in | Wt 219.2 lb

## 2022-09-18 DIAGNOSIS — F410 Panic disorder [episodic paroxysmal anxiety] without agoraphobia: Secondary | ICD-10-CM

## 2022-09-18 DIAGNOSIS — E1165 Type 2 diabetes mellitus with hyperglycemia: Secondary | ICD-10-CM | POA: Diagnosis not present

## 2022-09-18 DIAGNOSIS — E538 Deficiency of other specified B group vitamins: Secondary | ICD-10-CM

## 2022-09-18 DIAGNOSIS — R5383 Other fatigue: Secondary | ICD-10-CM

## 2022-09-18 DIAGNOSIS — F411 Generalized anxiety disorder: Secondary | ICD-10-CM

## 2022-09-18 DIAGNOSIS — I10 Essential (primary) hypertension: Secondary | ICD-10-CM

## 2022-09-18 DIAGNOSIS — Z760 Encounter for issue of repeat prescription: Secondary | ICD-10-CM

## 2022-09-18 DIAGNOSIS — E782 Mixed hyperlipidemia: Secondary | ICD-10-CM | POA: Diagnosis not present

## 2022-09-18 LAB — POCT GLYCOSYLATED HEMOGLOBIN (HGB A1C): Hemoglobin A1C: 6.1 % — AB (ref 4.0–5.6)

## 2022-09-18 MED ORDER — TIRZEPATIDE 5 MG/0.5ML ~~LOC~~ SOAJ: 5.0000 mg | SUBCUTANEOUS | 5 refills | Status: DC

## 2022-09-18 MED ORDER — ALPRAZOLAM 0.25 MG PO TABS: 0.2500 mg | ORAL_TABLET | Freq: Every evening | ORAL | 1 refills | Status: DC | PRN

## 2022-09-18 NOTE — Progress Notes (Signed)
[facility] [place], [place] [address]  Internal MEDICINE  Office Visit Note  Patient Name: Rebecca Henry  [id number]  [id number]  Date of Service: 09/18/2022  Chief Complaint  Patient presents with   Hyperlipidemia   Diabetes    HPI Rebecca Henry presents for a follow-up visit for diabetes, hypertension, hyperlipidemia, and low B12.  Diabetes -- A1c improved to 6.1. lost 10 lbs since last office visit.  Hyperlipidemia -- takes atorvastatin 10 mg daily.  Hypertension -- bp controlled with atenolol 25 mg daily.  B12 deficiency -- was low, previously getting B12 shots.  GAD -- takes alprazolam as needed, working well. Needs refills.     Current Medication: Outpatient Encounter Medications as of 09/18/2022  Medication Sig   acetaminophen (TYLENOL) 500 MG tablet Take 500 mg by mouth every 6 (six) hours as needed.   atenolol (TENORMIN) 25 MG tablet Take 1 tablet (25 mg total) by mouth daily.   atorvastatin (LIPITOR) 10 MG tablet Take 1 tablet (10 mg total) by mouth daily.   blood glucose meter kit and supplies KIT Dispense based on patient and insurance preference. Use up to four times daily as directed. (FOR ICD-9 250.00, 250.01).   cetirizine (ZYRTEC) 5 MG tablet Take 5 mg by mouth daily.   chlorpheniramine (CHLOR-TRIMETON) 4 MG tablet Take 4 mg by mouth daily.   cyclobenzaprine (FLEXERIL) 5 MG tablet Take 1 tablet (5 mg total) by mouth 2 (two) times daily as needed for muscle spasms.   glucose blood (CONTOUR NEXT TEST) test strip Blood sugar testing testing three times daily  DX E11.65   ibuprofen (ADVIL) 200 MG tablet Take 200 mg by mouth every 6 (six) hours as needed. Takes as needed   loratadine (CLARITIN) 10 MG tablet Take 10 mg by mouth daily as needed.    meloxicam (MOBIC) 15 MG tablet Take 1 tablet (15 mg total) by mouth daily.   [DISCONTINUED] ALPRAZolam (XANAX) 0.25 MG tablet Take 1 tablet (0.25 mg total) by mouth at bedtime as needed for anxiety  (weeknights after 6pm).   [DISCONTINUED] tirzepatide [facility]) 5 MG/0.5ML Pen Inject 5 mg into the skin once a week.   ALPRAZolam (XANAX) 0.25 MG tablet Take 1 tablet (0.25 mg total) by mouth at bedtime as needed for anxiety (weeknights after 6pm).   tirzepatide [facility]) 5 MG/0.5ML Pen Inject 5 mg into the skin once a week.   No facility-administered encounter medications on file as of 09/18/2022.    Surgical History: Past Surgical History:  Procedure Laterality Date   CESAREAN SECTION     COLONOSCOPY WITH PROPOFOL N/A 06/02/2019   Procedure: COLONOSCOPY WITH PROPOFOL;  Surgeon: Lucilla Lame, MD;  Location: [facility] ENDOSCOPY;  Service: Endoscopy;  Laterality: N/A;    Medical History: Past Medical History:  Diagnosis Date   Allergic rhinitis    Anxiety    Diabetes mellitus without complication ([place])    Hyperlipidemia    Sleep apnea    Patient states she doesn't have it but it was in history. Did sleep study years ago    Family History: Family History  Problem Relation Age of Onset   Hypertension Mother    Diabetes Maternal Grandmother    Hypothyroidism Maternal Grandmother    Breast cancer Neg Hx     Social History   Socioeconomic History   Marital status: Single    Spouse name: Not on file   Number of children: Not on file   Years of education: Not on file  Highest education level: Not on file  Occupational History   Not on file  Tobacco Use   Smoking status: Never   Smokeless tobacco: Never  Substance and Sexual Activity   Alcohol use: Not Currently   Drug use: No   Sexual activity: Not on file  Other Topics Concern   Not on file  Social History Narrative   Not on file   Social Determinants of Health   Financial Resource Strain: Not on file  Food Insecurity: Not on file  Transportation Needs: Not on file  Physical Activity: Not on file  Stress: Not on file  Social Connections: Not on file  Intimate Partner Violence: Not on file      Review of  Systems  Constitutional:  Negative for chills, fatigue and unexpected weight change.  HENT:  Negative for congestion, rhinorrhea, sneezing and sore throat.   Eyes:  Negative for redness.  Respiratory:  Negative for cough, chest tightness and shortness of breath.   Cardiovascular:  Negative for chest pain and palpitations.  Gastrointestinal:  Negative for abdominal pain, constipation, diarrhea, nausea and vomiting.  Genitourinary:  Negative for dysuria and frequency.  Musculoskeletal:  Negative for arthralgias, back pain, joint swelling and neck pain.  Skin:  Negative for rash.  Neurological: Negative.  Negative for tremors and numbness.  Hematological:  Negative for adenopathy. Does not bruise/bleed easily.  Psychiatric/Behavioral:  Negative for behavioral problems (Depression), sleep disturbance and suicidal ideas. The patient is not nervous/anxious.     Vital Signs: BP 122/70   Pulse 90   Temp 98.3 F (36.8 C)   Resp 16   Ht '5\' 2"'$  (1.575 m)   Wt 219 lb 3.2 oz (99.4 kg)   SpO2 95%   BMI 40.09 kg/m    Physical Exam Vitals reviewed.  Constitutional:      General: She is not in acute distress.    Appearance: Normal appearance. She is obese. She is not ill-appearing.  HENT:     Head: Normocephalic and atraumatic.  Eyes:     Pupils: Pupils are equal, round, and reactive to light.  Cardiovascular:     Rate and Rhythm: Normal rate and regular rhythm.  Pulmonary:     Effort: Pulmonary effort is normal. No respiratory distress.  Neurological:     Mental Status: She is alert and oriented to person, place, and time.  Psychiatric:        Mood and Affect: Mood normal.        Behavior: Behavior normal.        Assessment/Plan: 1. Type 2 diabetes mellitus with hyperglycemia, without long-term current use of insulin (HCC) A1c improving, stable at 6.1, continue mounjaro as prescribed. Will recheck A1c in September.  - POCT glycosylated hemoglobin (Hb A1C) - tirzepatide  (MOUNJARO) 5 MG/0.5ML Pen; Inject 5 mg into the skin once a week.  Dispense: 2 mL; Refill: 5  2. Mixed hyperlipidemia Continue atorvastatin as prescribed.   3. Essential hypertension Continue atenolol as prescribed.   4. B12 deficiency May continue OTC supplement.   5. Generalized anxiety disorder with panic attacks Refills ordered x3 months, continue as prescribed. Follow up for refills before September if needed. - ALPRAZolam (XANAX) 0.25 MG tablet; Take 1 tablet (0.25 mg total) by mouth at bedtime as needed for anxiety (weeknights after 6pm).  Dispense: 30 tablet; Refill: 1   General Counseling: kelsee skeete understanding of the findings of todays visit and agrees with plan of treatment. I have discussed any further diagnostic  evaluation that may be needed or ordered today. We also reviewed her medications today. she has been encouraged to call the office with any questions or concerns that should arise related to todays visit.    Orders Placed This Encounter  Procedures   POCT glycosylated hemoglobin (Hb A1C)    Meds ordered this encounter  Medications   ALPRAZolam (XANAX) 0.25 MG tablet    Sig: Take 1 tablet (0.25 mg total) by mouth at bedtime as needed for anxiety (weeknights after 6pm).    Dispense:  30 tablet    Refill:  1   tirzepatide (MOUNJARO) 5 MG/0.5ML Pen    Sig: Inject 5 mg into the skin once a week.    Dispense:  2 mL    Refill:  5    Future refills    Return for previously scheduled, CPE, Charon Smedberg PCP in september.   Total time spent:30 Minutes Time spent includes review of chart, medications, test results, and follow up plan with the patient.   [place] Controlled Substance Database was reviewed by me.  This patient was seen by Jonetta Osgood, FNP-C in collaboration with Dr. Clayborn Bigness as a part of collaborative care agreement.   Rondia Higginbotham R. Valetta Fuller, MSN, FNP-C Internal medicine

## 2022-09-29 ENCOUNTER — Encounter: Payer: Self-pay | Admitting: Nurse Practitioner

## 2022-11-08 ENCOUNTER — Other Ambulatory Visit: Payer: Self-pay | Admitting: Nurse Practitioner

## 2022-11-08 DIAGNOSIS — Z760 Encounter for issue of repeat prescription: Secondary | ICD-10-CM

## 2022-11-10 ENCOUNTER — Other Ambulatory Visit: Payer: Self-pay

## 2022-11-10 ENCOUNTER — Emergency Department
Admission: EM | Admit: 2022-11-10 | Discharge: 2022-11-10 | Disposition: A | Discharge status: Home / Self Care | Payer: BC Managed Care – PPO | Attending: Emergency Medicine | Admitting: Emergency Medicine

## 2022-11-10 DIAGNOSIS — R1030 Lower abdominal pain, unspecified: Secondary | ICD-10-CM | POA: Insufficient documentation

## 2022-11-10 DIAGNOSIS — R000 Tachycardia, unspecified: Secondary | ICD-10-CM | POA: Diagnosis not present

## 2022-11-10 DIAGNOSIS — E119 Type 2 diabetes mellitus without complications: Secondary | ICD-10-CM | POA: Diagnosis not present

## 2022-11-10 DIAGNOSIS — I10 Essential (primary) hypertension: Secondary | ICD-10-CM | POA: Diagnosis not present

## 2022-11-10 DIAGNOSIS — R197 Diarrhea, unspecified: Secondary | ICD-10-CM | POA: Insufficient documentation

## 2022-11-10 LAB — COMPREHENSIVE METABOLIC PANEL
ALT: 22 U/L (ref 0–44)
AST: 17 U/L (ref 15–41)
Albumin: 4.8 g/dL (ref 3.5–5.0)
Alkaline Phosphatase: 85 U/L (ref 38–126)
Anion gap: 14 (ref 5–15)
BUN: 19 mg/dL (ref 6–20)
CO2: 17 mmol/L — ABNORMAL LOW (ref 22–32)
Calcium: 9.9 mg/dL (ref 8.9–10.3)
Chloride: 106 mmol/L (ref 98–111)
Creatinine, Ser: 0.85 mg/dL (ref 0.44–1.00)
GFR, Estimated: >60 mL/min (ref >60)
Glucose, Bld: 162 mg/dL — ABNORMAL HIGH (ref 70–99)
Potassium: 4.4 mmol/L (ref 3.5–5.1)
Sodium: 137 mmol/L (ref 135–145)
Total Bilirubin: 1.5 mg/dL — ABNORMAL HIGH (ref 0.3–1.2)
Total Protein: 8.5 g/dL — ABNORMAL HIGH (ref 6.5–8.1)

## 2022-11-10 LAB — CBC
HCT: 48.1 % — ABNORMAL HIGH (ref 36.0–46.0)
Hemoglobin: 16.5 g/dL — ABNORMAL HIGH (ref 12.0–15.0)
MCH: 31 pg (ref 26.0–34.0)
MCHC: 34.3 g/dL (ref 30.0–36.0)
MCV: 90.2 fL (ref 80.0–100.0)
Platelets: 341 10*3/uL (ref 150–400)
RBC: 5.33 MIL/uL — ABNORMAL HIGH (ref 3.87–5.11)
RDW: 12.1 % (ref 11.5–15.5)
WBC: 11.2 10*3/uL — ABNORMAL HIGH (ref 4.0–10.5)
nRBC: 0 % (ref 0.0–0.2)

## 2022-11-10 LAB — LIPASE, BLOOD: Lipase: 27 U/L (ref 11–51)

## 2022-11-10 NOTE — ED Provider Notes (Signed)
[facility] Provider Note    Event Date/Time   First MD Initiated Contact with Patient 11/10/22 [phone]     (approximate)   History   Diarrhea   HPI  Rebecca Henry is a 57 y.o. female past medical history of diabetes without any presents with diarrhea.  Symptoms started today.  Patient has had counts episodes of diarrhea.  Nonbloody.  Did have some lower abdominal cramping but not significant pain.  No fevers.  She took Imodium and was able to go to sleep but 2 hours later woke up around 1145 and had a large loose stool.  She then felt lightheaded on the toilet like she could pass out.  Has been eating and drinking but not much.  No recent antibiotics to her.  No recent travel out of country.  Denies nausea vomiting     Past Medical History:  Diagnosis Date   Allergic rhinitis    Anxiety    Diabetes mellitus without complication    Hyperlipidemia    Sleep apnea    Patient states she doesn't have it but it was in history. Did sleep study years ago    Patient Active Problem List   Diagnosis Date Noted   Encounter for general adult medical examination with abnormal findings 08/14/2020   Encounter for screening mammogram for malignant neoplasm of breast 04/17/2020   Encounter for screening colonoscopy    Rectal polyp    Type 2 diabetes mellitus with hyperglycemia 01/12/2019   Sore throat 10/09/2018   Strep throat 10/09/2018   Nonintractable headache 10/09/2018   Type 2 diabetes mellitus with hyperglycemia, without long-term current use of insulin 08/24/2018   Mixed hyperlipidemia 08/24/2018   Primary osteoarthritis of left shoulder 08/24/2018   Anxiety 08/18/2018   Screening for colon cancer 01/05/2018   Morbid obesity 01/05/2018   Vitamin D deficiency 01/05/2018   Atopic dermatitis 01/05/2018   Dysuria 10/15/2017   Urinary tract infection without hematuria 10/15/2017   Impaired fasting glucose 10/15/2017   Low back pain at multiple sites  10/13/2017   Generalized anxiety disorder with panic attacks 10/13/2017   Essential hypertension 10/13/2017     Physical Exam  Triage Vital Signs: ED Triage Vitals  Enc Vitals Group     BP 11/10/22 0049 (!) 130/98     Pulse Rate 11/10/22 0049 100     Resp 11/10/22 0049 18     Temp 11/10/22 0049 98.1 F (36.7 C)     Temp Source 11/10/22 0049 Oral     SpO2 11/10/22 0049 97 %     Weight 11/10/22 0047 230 lb (104.3 kg)     Height 11/10/22 0047 5\' 2"  (1.575 m)     Head Circumference --      Peak Flow --      Pain Score 11/10/22 0047 2     Pain Loc --      Pain Edu? --      Excl. in GC? --     Most recent vital signs: Vitals:   11/10/22 0430 11/10/22 0500  BP:    Pulse: 90 88  Resp:    Temp:    SpO2: 95% 94%     General: Awake, no distress.  CV:  Good peripheral perfusion.  Resp:  Normal effort.  Abd:  No distention.  Abdomen soft nontender throughout Neuro:             Awake, Alert, Oriented x 3  Other:  ED Results / Procedures / Treatments  Labs (all labs ordered are listed, but only abnormal results are displayed) Labs Reviewed  COMPREHENSIVE METABOLIC PANEL - Abnormal; Notable for the following components:      Result Value   CO2 17 (*)    Glucose, Bld 162 (*)    Total Protein 8.5 (*)    Total Bilirubin 1.5 (*)    All other components within normal limits  CBC - Abnormal; Notable for the following components:   WBC 11.2 (*)    RBC 5.33 (*)    Hemoglobin 16.5 (*)    HCT 48.1 (*)    All other components within normal limits  LIPASE, BLOOD  URINALYSIS, ROUTINE W REFLEX MICROSCOPIC  POC URINE PREG, ED     EKG  EKG interpretation performed by myself: NSR, nml axis, nml intervals, no acute ischemic changes    RADIOLOGY    PROCEDURES:  Critical Care performed: No  Procedures  The patient is on the cardiac monitor to evaluate for evidence of arrhythmia and/or significant heart rate changes.   MEDICATIONS ORDERED IN ED: Medications - No  data to display   IMPRESSION / MDM / ASSESSMENT AND PLAN / ED COURSE  I reviewed the triage vital signs and the nursing notes.                              Patient's presentation is most consistent with acute complicated illness / injury requiring diagnostic workup.  Differential diagnosis includes, but is not limited to, vasovagal episode, electrolyte abnormality, infectious diarrhea, C. Difficile  Patient is a 57 year old female who presents with diarrhea.  Symptoms started today.  Patient has multiple episodes of nonbloody diarrhea with some mild associate abdominal cramping.  No fevers.  No recent antibiotics or travel out of the country.  She took some Imodium which seemed to help for several hours but then woke up from sleep with large episode of diarrhea and vasovagal episode on the toilet.  Felt lightheaded but did not syncopized.  Vital signs are stable.  Her laboratory work does suggest some element of dehydration.  Hemoglobin is 16 bicarb 17 but no anion gap.  Patient looks well abdomen is benign.  Discussed with her the option of placing an IV and giving IV hydration versus oral hydration.  Patient is not having any vomiting or nausea or pain at the time has not had an episode of diarrhea about 5 hours so would like to try p.o. hydration.  Patient tolerated p.o. hydration.  Recommend she continue to use the Imodium.  We discussed food choices to help prevent ongoing diarrhea.       FINAL CLINICAL IMPRESSION(S) / ED DIAGNOSES   Final diagnoses:  Diarrhea, unspecified type     Rx / DC Orders   ED Discharge Orders     None        Note:  This document was prepared using Dragon voice recognition software and may include unintentional dictation errors.   Georga Hacking, MD 11/10/22 [phone]

## 2022-11-10 NOTE — ED Triage Notes (Addendum)
Pt to ED via [facility] c/o diarrhea for 12 hrs. Pt took imodium with no relief. Pt endorses dizziness that started prior to EMS arrival Denies any abd pain, vomiting, cp, sob

## 2022-11-12 ENCOUNTER — Telehealth: Payer: BC Managed Care – PPO | Admitting: Physician Assistant

## 2022-11-12 VITALS — Ht 62.0 in | Wt 219.0 lb

## 2022-11-12 DIAGNOSIS — R197 Diarrhea, unspecified: Secondary | ICD-10-CM

## 2022-11-12 NOTE — Progress Notes (Signed)
[facility] [address] [place], [place] [address]  Internal MEDICINE  Telephone Visit  Patient Name: Rebecca Henry  [id number]  [id number]  Date of Service: 11/12/2022  I connected with the patient at 10:43 by telephone and verified the patients identity using two identifiers.   I discussed the limitations, risks, security and privacy concerns of performing an evaluation and management service by telephone and the availability of in person appointments. I also discussed with the patient that there may be a patient responsible charge related to the service.  The patient expressed understanding and agrees to proceed.    Chief Complaint  Patient presents with   Telephone Assessment    [phone]   Telephone Screen   Diarrhea        Nausea    Abdominal discomfort     HPI Pt is here for virtual sick visit -Friday and Sunday was having diarrhea every and lots of nausea. No diarrhea sat but still didn't feel great and started back on Sunday early AM. Was taking immodium, but still was having diarrhea. Was taking pepto as well. Did take 1 tab of promethazine last night with pepto and was able to sleep last night -Went to ED Friday night and had some abdominal discomfort, but diarrhea subsided while in ED and wasn't as nauseous and therefore tolerated PO fluids and was discharged. -Feeling much better today, though is nervous symptoms could come back. No BM, abdominal pain, or cramping today. Not feeling nauseous, though if this returns she may request a script for promethazine. -Appetite is better. Ate rice a roni chicken pack and ate the whole thing. Had more liquid at a time this morning as well. -Discussed continuing to try bland foods and keeping hydrated but not too much at one time.   Current Medication: Outpatient Encounter Medications as of 11/12/2022  Medication Sig   acetaminophen (TYLENOL) 500 MG tablet Take 500 mg by mouth every 6 (six) hours as needed.    ALPRAZolam (XANAX) 0.25 MG tablet Take 1 tablet (0.25 mg total) by mouth at bedtime as needed for anxiety (weeknights after 6pm).   atenolol (TENORMIN) 25 MG tablet TAKE 1 TABLET BY MOUTH DAILY.   atorvastatin (LIPITOR) 10 MG tablet Take 1 tablet (10 mg total) by mouth daily.   blood glucose meter kit and supplies KIT Dispense based on patient and insurance preference. Use up to four times daily as directed. (FOR ICD-9 250.00, 250.01).   cetirizine (ZYRTEC) 5 MG tablet Take 5 mg by mouth daily.   chlorpheniramine (CHLOR-TRIMETON) 4 MG tablet Take 4 mg by mouth daily.   cyclobenzaprine (FLEXERIL) 5 MG tablet Take 1 tablet (5 mg total) by mouth 2 (two) times daily as needed for muscle spasms.   glucose blood (CONTOUR NEXT TEST) test strip Blood sugar testing testing three times daily  DX E11.65   ibuprofen (ADVIL) 200 MG tablet Take 200 mg by mouth every 6 (six) hours as needed. Takes as needed   loratadine (CLARITIN) 10 MG tablet Take 10 mg by mouth daily as needed.    meloxicam (MOBIC) 15 MG tablet Take 1 tablet (15 mg total) by mouth daily.   tirzepatide [facility]) 5 MG/0.5ML Pen Inject 5 mg into the skin once a week.   No facility-administered encounter medications on file as of 11/12/2022.    Surgical History: Past Surgical History:  Procedure Laterality Date   CESAREAN SECTION     COLONOSCOPY WITH PROPOFOL N/A 06/02/2019   Procedure: COLONOSCOPY  WITH PROPOFOL;  Surgeon: Midge Minium, MD;  Location: [facility] ENDOSCOPY;  Service: Endoscopy;  Laterality: N/A;    Medical History: Past Medical History:  Diagnosis Date   Allergic rhinitis    Anxiety    Diabetes mellitus without complication    Hyperlipidemia    Sleep apnea    Patient states she doesn't have it but it was in history. Did sleep study years ago    Family History: Family History  Problem Relation Age of Onset   Hypertension Mother    Diabetes Maternal Grandmother    Hypothyroidism Maternal Grandmother    Breast cancer  Neg Hx     Social History   Socioeconomic History   Marital status: Single    Spouse name: Not on file   Number of children: Not on file   Years of education: Not on file   Highest education level: Not on file  Occupational History   Not on file  Tobacco Use   Smoking status: Never   Smokeless tobacco: Never  Substance and Sexual Activity   Alcohol use: Not Currently   Drug use: No   Sexual activity: Not on file  Other Topics Concern   Not on file  Social History Narrative   Not on file   Social Determinants of Health   Financial Resource Strain: Not on file  Food Insecurity: Not on file  Transportation Needs: Not on file  Physical Activity: Not on file  Stress: Not on file  Social Connections: Not on file  Intimate Partner Violence: Not on file      Review of Systems  Constitutional:  Negative for fatigue and fever.  HENT:  Negative for congestion, mouth sores and postnasal drip.   Respiratory:  Negative for cough.   Cardiovascular:  Negative for chest pain.  Gastrointestinal:  Negative for abdominal distention, abdominal pain, diarrhea, nausea and vomiting.  Genitourinary:  Negative for flank pain.  Psychiatric/Behavioral: Negative.      Vital Signs: Ht 5\' 2"  (1.575 m)   Wt 219 lb (99.3 kg)   BMI 40.06 kg/m    Observation/Objective:  Pt is able to carry out conversation  Assessment/Plan: 1. Acute diarrhea Improved now without any symptoms today. Will continue to eat bland foods and stay well hydrated. Advised to try small amounts at a time. If nausea returns may call for promethazine script prn.   General Counseling: alto mongold understanding of the findings of today's phone visit and agrees with plan of treatment. I have discussed any further diagnostic evaluation that may be needed or ordered today. We also reviewed her medications today. she has been encouraged to call the office with any questions or concerns that should arise related to  todays visit.    No orders of the defined types were placed in this encounter.   No orders of the defined types were placed in this encounter.   Time spent:30 Minutes    Dr Lyndon Code Internal medicine

## 2022-11-29 ENCOUNTER — Other Ambulatory Visit: Payer: Self-pay | Admitting: Nurse Practitioner

## 2022-11-29 MED ORDER — TIRZEPATIDE 2.5 MG/0.5ML ~~LOC~~ SOAJ: 2.5000 mg | SUBCUTANEOUS | 2 refills | Status: DC

## 2022-12-18 ENCOUNTER — Telehealth: Payer: Self-pay

## 2022-12-18 NOTE — Telephone Encounter (Signed)
Completed and faxed P.A. for patient's Mounjaro.

## 2022-12-26 ENCOUNTER — Telehealth: Payer: Self-pay

## 2022-12-26 DIAGNOSIS — A09 Infectious gastroenteritis and colitis, unspecified: Secondary | ICD-10-CM | POA: Diagnosis not present

## 2022-12-26 DIAGNOSIS — R1013 Epigastric pain: Secondary | ICD-10-CM | POA: Diagnosis not present

## 2022-12-26 DIAGNOSIS — R197 Diarrhea, unspecified: Secondary | ICD-10-CM | POA: Diagnosis not present

## 2022-12-26 DIAGNOSIS — E119 Type 2 diabetes mellitus without complications: Secondary | ICD-10-CM | POA: Diagnosis not present

## 2022-12-26 DIAGNOSIS — R110 Nausea: Secondary | ICD-10-CM | POA: Diagnosis not present

## 2022-12-26 DIAGNOSIS — K921 Melena: Secondary | ICD-10-CM | POA: Diagnosis not present

## 2022-12-26 DIAGNOSIS — I10 Essential (primary) hypertension: Secondary | ICD-10-CM | POA: Diagnosis not present

## 2022-12-26 DIAGNOSIS — I2510 Atherosclerotic heart disease of native coronary artery without angina pectoris: Secondary | ICD-10-CM | POA: Diagnosis not present

## 2022-12-26 DIAGNOSIS — R109 Unspecified abdominal pain: Secondary | ICD-10-CM | POA: Diagnosis not present

## 2022-12-26 NOTE — Telephone Encounter (Signed)
Pt called that she was having generalized abdominal pain,nausea and diarrhea and bleeding 3 times as per alyssa advised go to ED and alyssa spoke with her advised her go to ED

## 2023-01-09 ENCOUNTER — Telehealth: Payer: Self-pay

## 2023-01-09 NOTE — Telephone Encounter (Signed)
Pt advised that we faxed additional form fax back  to [facility]

## 2023-01-12 DIAGNOSIS — N390 Urinary tract infection, site not specified: Secondary | ICD-10-CM | POA: Diagnosis not present

## 2023-01-15 ENCOUNTER — Telehealth: Payer: Self-pay

## 2023-01-15 NOTE — Telephone Encounter (Signed)
Completed P.A. for patient's Mounjaro 5mg .

## 2023-01-17 ENCOUNTER — Telehealth: Payer: Self-pay

## 2023-01-17 NOTE — Telephone Encounter (Signed)
Left message for patient regarding approval for 2.5mg  Mounjaro.

## 2023-01-23 ENCOUNTER — Ambulatory Visit: Payer: BC Managed Care – PPO | Admitting: Nurse Practitioner

## 2023-01-25 ENCOUNTER — Ambulatory Visit: Payer: BC Managed Care – PPO | Admitting: Nurse Practitioner

## 2023-02-18 ENCOUNTER — Other Ambulatory Visit: Payer: Self-pay | Admitting: Nurse Practitioner

## 2023-02-18 DIAGNOSIS — E1165 Type 2 diabetes mellitus with hyperglycemia: Secondary | ICD-10-CM

## 2023-03-15 ENCOUNTER — Ambulatory Visit
Admission: RE | Admit: 2023-03-15 | Discharge: 2023-03-15 | Disposition: A | Discharge status: Home / Self Care | Payer: BC Managed Care – PPO | Source: Ambulatory Visit | Attending: Nurse Practitioner | Admitting: Nurse Practitioner

## 2023-03-15 DIAGNOSIS — Z1231 Encounter for screening mammogram for malignant neoplasm of breast: Secondary | ICD-10-CM | POA: Diagnosis not present

## 2023-03-29 ENCOUNTER — Telehealth: Payer: Self-pay

## 2023-03-29 NOTE — Telephone Encounter (Signed)
Patient called wanting to be seen today for uti, we have no open slot, pt was advised to go to urgent care.

## 2023-03-30 DIAGNOSIS — N390 Urinary tract infection, site not specified: Secondary | ICD-10-CM | POA: Diagnosis not present

## 2023-03-30 DIAGNOSIS — R350 Frequency of micturition: Secondary | ICD-10-CM | POA: Diagnosis not present

## 2023-03-30 DIAGNOSIS — R399 Unspecified symptoms and signs involving the genitourinary system: Secondary | ICD-10-CM | POA: Diagnosis not present

## 2023-03-30 DIAGNOSIS — R300 Dysuria: Secondary | ICD-10-CM | POA: Diagnosis not present

## 2023-04-17 ENCOUNTER — Other Ambulatory Visit: Payer: Self-pay | Admitting: Nurse Practitioner

## 2023-04-17 DIAGNOSIS — Z760 Encounter for issue of repeat prescription: Secondary | ICD-10-CM

## 2023-04-18 ENCOUNTER — Other Ambulatory Visit: Payer: Self-pay | Admitting: Nurse Practitioner

## 2023-04-18 DIAGNOSIS — Z760 Encounter for issue of repeat prescription: Secondary | ICD-10-CM

## 2023-05-06 ENCOUNTER — Encounter: Payer: BC Managed Care – PPO | Admitting: Nurse Practitioner

## 2023-05-13 ENCOUNTER — Ambulatory Visit: Payer: BC Managed Care – PPO | Admitting: Nurse Practitioner

## 2023-05-13 ENCOUNTER — Other Ambulatory Visit: Payer: Self-pay | Admitting: Nurse Practitioner

## 2023-05-13 ENCOUNTER — Encounter: Payer: Self-pay | Admitting: Nurse Practitioner

## 2023-05-13 VITALS — BP 122/78 | HR 90 | Temp 96.6°F | Resp 16 | Ht 62.0 in | Wt 228.6 lb

## 2023-05-13 DIAGNOSIS — M545 Low back pain, unspecified: Secondary | ICD-10-CM | POA: Diagnosis not present

## 2023-05-13 DIAGNOSIS — E785 Hyperlipidemia, unspecified: Secondary | ICD-10-CM

## 2023-05-13 DIAGNOSIS — E782 Mixed hyperlipidemia: Secondary | ICD-10-CM | POA: Diagnosis not present

## 2023-05-13 DIAGNOSIS — E1165 Type 2 diabetes mellitus with hyperglycemia: Secondary | ICD-10-CM | POA: Diagnosis not present

## 2023-05-13 DIAGNOSIS — Z0001 Encounter for general adult medical examination with abnormal findings: Secondary | ICD-10-CM

## 2023-05-13 DIAGNOSIS — E66813 Obesity, class 3: Secondary | ICD-10-CM

## 2023-05-13 DIAGNOSIS — G8929 Other chronic pain: Secondary | ICD-10-CM

## 2023-05-13 DIAGNOSIS — I10 Essential (primary) hypertension: Secondary | ICD-10-CM | POA: Diagnosis not present

## 2023-05-13 DIAGNOSIS — E1169 Type 2 diabetes mellitus with other specified complication: Secondary | ICD-10-CM | POA: Diagnosis not present

## 2023-05-13 DIAGNOSIS — Z6841 Body Mass Index (BMI) 40.0 and over, adult: Secondary | ICD-10-CM

## 2023-05-13 DIAGNOSIS — F410 Panic disorder [episodic paroxysmal anxiety] without agoraphobia: Secondary | ICD-10-CM

## 2023-05-13 DIAGNOSIS — E538 Deficiency of other specified B group vitamins: Secondary | ICD-10-CM | POA: Diagnosis not present

## 2023-05-13 DIAGNOSIS — Z760 Encounter for issue of repeat prescription: Secondary | ICD-10-CM

## 2023-05-13 DIAGNOSIS — E559 Vitamin D deficiency, unspecified: Secondary | ICD-10-CM | POA: Diagnosis not present

## 2023-05-13 DIAGNOSIS — F411 Generalized anxiety disorder: Secondary | ICD-10-CM

## 2023-05-13 DIAGNOSIS — R5383 Other fatigue: Secondary | ICD-10-CM | POA: Diagnosis not present

## 2023-05-13 MED ORDER — ATENOLOL 25 MG PO TABS: 25.0000 mg | ORAL_TABLET | Freq: Every day | ORAL | 11 refills | Status: DC

## 2023-05-13 MED ORDER — ALPRAZOLAM 0.25 MG PO TABS: 0.2500 mg | ORAL_TABLET | Freq: Every evening | ORAL | 1 refills | Status: DC | PRN

## 2023-05-13 MED ORDER — MELOXICAM 15 MG PO TABS: 15.0000 mg | ORAL_TABLET | Freq: Every day | ORAL | 11 refills | Status: DC

## 2023-05-13 MED ORDER — CYCLOBENZAPRINE HCL 5 MG PO TABS
5.0000 mg | ORAL_TABLET | Freq: Two times a day (BID) | ORAL | 2 refills | Status: DC | PRN
Start: 2023-05-13 — End: 2023-10-21

## 2023-05-13 MED ORDER — MOUNJARO 5 MG/0.5ML ~~LOC~~ SOAJ
5.0000 mg | SUBCUTANEOUS | 5 refills | Status: DC
Start: 2023-05-13 — End: 2023-09-16

## 2023-05-13 NOTE — Progress Notes (Signed)
[facility] [address] [place], [place] [address]  Internal MEDICINE  Office Visit Note  Patient Name: Rebecca Henry  [id number]  [id number]  Date of Service: 05/13/2023  Chief Complaint  Patient presents with   Hyperlipidemia   Diabetes   Annual Exam    HPI Rebecca Henry presents for an annual well visit and physical exam.  Well-appearing 57 y.o. female with diabetes, hypertension, osteoarthritis, high cholesterol, and GAD.  Routine CRC screening: --colonoscopy was done in 2020, due in 2030  Routine mammogram: done this year in August-- BI-RADS category 1 negative  Pap smear: due this year, wants to defer until next year  Eye exam and/or foot exam: has eyue doctor, will schedule at later date  Labs: getting labs done today New or worsening pain: none  CT ABDOMEN RESULTS: Subtle delayed left nephrogram and perinephric stranding. Additionally, mild perivesicular stranding. Nonspecific, recommend correlation with clinical history and urinalysis, Mild coronary artery calcifications, Mild multilevel degenerative changes. Small fat-containing umbilical hernia.    Current Medication: Outpatient Encounter Medications as of 05/13/2023  Medication Sig   acetaminophen (TYLENOL) 500 MG tablet Take 500 mg by mouth every 6 (six) hours as needed.   atorvastatin (LIPITOR) 10 MG tablet TAKE 1 TABLET BY MOUTH DAILY.   blood glucose meter kit and supplies KIT Dispense based on patient and insurance preference. Use up to four times daily as directed. (FOR ICD-9 250.00, 250.01).   cetirizine (ZYRTEC) 5 MG tablet Take 5 mg by mouth daily.   chlorpheniramine (CHLOR-TRIMETON) 4 MG tablet Take 4 mg by mouth daily.   glucose blood (CONTOUR NEXT TEST) test strip Blood sugar testing testing three times daily  DX E11.65   ibuprofen (ADVIL) 200 MG tablet Take 200 mg by mouth every 6 (six) hours as needed. Takes as needed   loratadine (CLARITIN) 10 MG tablet Take 10 mg by mouth daily as needed.     [DISCONTINUED] ALPRAZolam (XANAX) 0.25 MG tablet Take 1 tablet (0.25 mg total) by mouth at bedtime as needed for anxiety (weeknights after 6pm).   [DISCONTINUED] atenolol (TENORMIN) 25 MG tablet TAKE 1 TABLET BY MOUTH DAILY   [DISCONTINUED] cyclobenzaprine (FLEXERIL) 5 MG tablet Take 1 tablet (5 mg total) by mouth 2 (two) times daily as needed for muscle spasms.   [DISCONTINUED] meloxicam (MOBIC) 15 MG tablet Take 1 tablet (15 mg total) by mouth daily.   [DISCONTINUED] MOUNJARO 5 MG/0.5ML Pen INJECT 5 MG UNDER THE SKIN ONE DAY A WEEK   [DISCONTINUED] tirzepatide (MOUNJARO) 2.5 MG/0.5ML Pen Inject 2.5 mg into the skin once a week.   ALPRAZolam (XANAX) 0.25 MG tablet Take 1 tablet (0.25 mg total) by mouth at bedtime as needed for anxiety (weeknights after 6pm).   atenolol (TENORMIN) 25 MG tablet Take 1 tablet (25 mg total) by mouth daily.   cyclobenzaprine (FLEXERIL) 5 MG tablet Take 1 tablet (5 mg total) by mouth 2 (two) times daily as needed for muscle spasms.   meloxicam (MOBIC) 15 MG tablet Take 1 tablet (15 mg total) by mouth daily.   tirzepatide [facility]) 5 MG/0.5ML Pen Inject 5 mg into the skin once a week.   No facility-administered encounter medications on file as of 05/13/2023.    Surgical History: Past Surgical History:  Procedure Laterality Date   CESAREAN SECTION     COLONOSCOPY WITH PROPOFOL N/A 06/02/2019   Procedure: COLONOSCOPY WITH PROPOFOL;  Surgeon: Midge Minium, MD;  Location: [facility] ENDOSCOPY;  Service: Endoscopy;  Laterality: N/A;    Medical  History: Past Medical History:  Diagnosis Date   Allergic rhinitis    Anxiety    Diabetes mellitus without complication (HCC)    Hyperlipidemia    Sleep apnea    Patient states she doesn't have it but it was in history. Did sleep study years ago    Family History: Family History  Problem Relation Age of Onset   Hypertension Mother    Diabetes Maternal Grandmother    Hypothyroidism Maternal Grandmother    Breast cancer  Neg Hx     Social History   Socioeconomic History   Marital status: Single    Spouse name: Not on file   Number of children: Not on file   Years of education: Not on file   Highest education level: Not on file  Occupational History   Not on file  Tobacco Use   Smoking status: Never   Smokeless tobacco: Never  Substance and Sexual Activity   Alcohol use: Not Currently   Drug use: No   Sexual activity: Not on file  Other Topics Concern   Not on file  Social History Narrative   Not on file   Social Determinants of Health   Financial Resource Strain: Not on file  Food Insecurity: Not on file  Transportation Needs: Not on file  Physical Activity: Not on file  Stress: Not on file  Social Connections: Not on file  Intimate Partner Violence: Not on file      Review of Systems  Constitutional:  Negative for activity change, appetite change, chills, fatigue, fever and unexpected weight change.  HENT: Negative.  Negative for congestion, ear pain, rhinorrhea, sore throat and trouble swallowing.   Eyes: Negative.   Respiratory: Negative.  Negative for cough, chest tightness, shortness of breath and wheezing.   Cardiovascular: Negative.  Negative for chest pain.  Gastrointestinal: Negative.  Negative for abdominal pain, blood in stool, constipation, diarrhea, nausea and vomiting.  Endocrine: Negative.   Genitourinary: Negative.  Negative for difficulty urinating, dysuria, frequency, hematuria and urgency.  Musculoskeletal: Negative.  Negative for arthralgias, back pain, joint swelling, myalgias and neck pain.  Skin: Negative.  Negative for rash and wound.  Allergic/Immunologic: Negative.  Negative for immunocompromised state.  Neurological: Negative.  Negative for dizziness, seizures, numbness and headaches.  Hematological: Negative.   Psychiatric/Behavioral: Negative.  Negative for behavioral problems, self-injury and suicidal ideas. The patient is not nervous/anxious.      Vital Signs: BP 122/78   Pulse 90   Temp (!) 96.6 F (35.9 C)   Resp 16   Ht 5\' 2"  (1.575 m)   Wt 228 lb 9.6 oz (103.7 kg)   SpO2 98%   BMI 41.81 kg/m    Physical Exam Vitals reviewed.  Constitutional:      General: She is not in acute distress.    Appearance: Normal appearance. She is well-developed. She is obese. She is not ill-appearing or diaphoretic.  HENT:     Head: Normocephalic and atraumatic.     Right Ear: Tympanic membrane, ear canal and external ear normal.     Left Ear: Tympanic membrane, ear canal and external ear normal.     Nose: Nose normal. No congestion or rhinorrhea.     Mouth/Throat:     Mouth: Mucous membranes are moist.     Pharynx: Oropharynx is clear. No oropharyngeal exudate or posterior oropharyngeal erythema.  Eyes:     General: No scleral icterus.       Right eye: No discharge.  Left eye: No discharge.     Extraocular Movements: Extraocular movements intact.     Conjunctiva/sclera: Conjunctivae normal.     Pupils: Pupils are equal, round, and reactive to light.  Neck:     Thyroid: No thyromegaly.     Vascular: No JVD.     Trachea: No tracheal deviation.  Cardiovascular:     Rate and Rhythm: Normal rate and regular rhythm.     Pulses: Normal pulses.          Dorsalis pedis pulses are 2+ on the right side and 2+ on the left side.       Posterior tibial pulses are 2+ on the right side and 2+ on the left side.     Heart sounds: Normal heart sounds. No murmur heard.    No friction rub. No gallop.  Pulmonary:     Effort: Pulmonary effort is normal. No respiratory distress.     Breath sounds: Normal breath sounds. No stridor. No wheezing or rales.  Chest:     Chest wall: No tenderness.  Abdominal:     General: Bowel sounds are normal. There is no distension.     Palpations: Abdomen is soft. There is no mass.     Tenderness: There is no abdominal tenderness. There is no guarding or rebound.  Musculoskeletal:        General: No  tenderness or deformity. Normal range of motion.     Cervical back: Normal range of motion and neck supple.     Right foot: Normal range of motion. No deformity, bunion, Charcot foot, foot drop or prominent metatarsal heads.     Left foot: Normal range of motion. No deformity, bunion, Charcot foot, foot drop or prominent metatarsal heads.  Feet:     Right foot:     Protective Sensation: 6 sites tested.  6 sites sensed.     Skin integrity: Dry skin present. No ulcer, blister, skin breakdown, erythema, warmth, callus or fissure.     Toenail Condition: Right toenails are abnormally thick and long.     Left foot:     Protective Sensation: 6 sites tested.  6 sites sensed.     Skin integrity: Dry skin present. No ulcer, blister, skin breakdown, erythema, warmth, callus or fissure.     Toenail Condition: Left toenails are abnormally thick and long.  Lymphadenopathy:     Cervical: No cervical adenopathy.  Skin:    General: Skin is warm and dry.     Capillary Refill: Capillary refill takes less than 2 seconds.     Coloration: Skin is not pale.     Findings: No erythema or rash.  Neurological:     Mental Status: She is alert and oriented to person, place, and time.     Cranial Nerves: No cranial nerve deficit.     Motor: No abnormal muscle tone.     Coordination: Coordination normal.     Gait: Gait normal.     Deep Tendon Reflexes: Reflexes are normal and symmetric.  Psychiatric:        Mood and Affect: Mood normal.        Behavior: Behavior normal.        Thought Content: Thought content normal.        Judgment: Judgment normal.        Assessment/Plan: 1. Encounter for routine adult health examination with abnormal findings Age-appropriate preventive screenings and vaccinations discussed, annual physical exam completed. Routine labs for health maintenance previously ordered, patient  has lab slip. PHM updated.   2. Type 2 diabetes mellitus with other specified complication, without  long-term current use of insulin (HCC) Improving, continue mounjaro as prescribed.  - tirzepatide [facility]) 5 MG/0.5ML Pen; Inject 5 mg into the skin once a week.  Dispense: 2 mL; Refill: 5  3. Essential hypertension Stable, continue atenolol as prescribed.  - atenolol (TENORMIN) 25 MG tablet; Take 1 tablet (25 mg total) by mouth daily.  Dispense: 30 tablet; Refill: 11  4. Hyperlipidemia associated with type 2 diabetes mellitus (HCC) Continue atorvastatin as prescribed   5. Chronic midline low back pain without sciatica Continue meloxicam and cyclobenzaprine as prescribed  - meloxicam (MOBIC) 15 MG tablet; Take 1 tablet (15 mg total) by mouth daily.  Dispense: 30 tablet; Refill: 11 - cyclobenzaprine (FLEXERIL) 5 MG tablet; Take 1 tablet (5 mg total) by mouth 2 (two) times daily as needed for muscle spasms.  Dispense: 45 tablet; Refill: 2  6. Class 3 severe obesity due to excess calories with serious comorbidity and body mass index (BMI) of 40.0 to 44.9 in adult [facility]) Continued use of mounjaro and diet modifications to help control glucose and cholesterol will aid in weight loss as well.   7. Generalized anxiety disorder with panic attacks Continue prn alprazolam as prescribed  - ALPRAZolam (XANAX) 0.25 MG tablet; Take 1 tablet (0.25 mg total) by mouth at bedtime as needed for anxiety (weeknights after 6pm).  Dispense: 30 tablet; Refill: 1     General Counseling: matilynn dacey understanding of the findings of todays visit and agrees with plan of treatment. I have discussed any further diagnostic evaluation that may be needed or ordered today. We also reviewed her medications today. she has been encouraged to call the office with any questions or concerns that should arise related to todays visit.    No orders of the defined types were placed in this encounter.   Meds ordered this encounter  Medications   tirzepatide (MOUNJARO) 5 MG/0.5ML Pen    Sig: Inject 5 mg into the skin  once a week.    Dispense:  2 mL    Refill:  5   ALPRAZolam (XANAX) 0.25 MG tablet    Sig: Take 1 tablet (0.25 mg total) by mouth at bedtime as needed for anxiety (weeknights after 6pm).    Dispense:  30 tablet    Refill:  1   meloxicam (MOBIC) 15 MG tablet    Sig: Take 1 tablet (15 mg total) by mouth daily.    Dispense:  30 tablet    Refill:  11    For future refills, do not fill, patient will call   atenolol (TENORMIN) 25 MG tablet    Sig: Take 1 tablet (25 mg total) by mouth daily.    Dispense:  30 tablet    Refill:  11   cyclobenzaprine (FLEXERIL) 5 MG tablet    Sig: Take 1 tablet (5 mg total) by mouth 2 (two) times daily as needed for muscle spasms.    Dispense:  45 tablet    Refill:  2    For future refills, patient will call when she needs to fill it    Return in about 4 months (around 09/13/2023) for F/U, Recheck A1C, Aaban Griep PCP.   Total time spent:30 Minutes Time spent includes review of chart, medications, test results, and follow up plan with the patient.   [place] Controlled Substance Database was reviewed by me.  This patient was seen by Sallyanne Kuster,  FNP-C in collaboration with Dr. Beverely Risen as a part of collaborative care agreement.  Anees Vanecek R. Tedd Sias, MSN, FNP-C Internal medicine

## 2023-05-14 ENCOUNTER — Encounter: Payer: Self-pay | Admitting: Nurse Practitioner

## 2023-05-14 LAB — T4, FREE: Free T4: 1.06 ng/dL (ref 0.82–1.77)

## 2023-05-14 LAB — COMPREHENSIVE METABOLIC PANEL
ALT: 19 [IU]/L (ref 0–32)
AST: 20 [IU]/L (ref 0–40)
Albumin: 4.7 g/dL (ref 3.8–4.9)
Alkaline Phosphatase: 115 [IU]/L (ref 44–121)
BUN/Creatinine Ratio: 12 (ref 9–23)
BUN: 11 mg/dL (ref 6–24)
Bilirubin Total: 0.6 mg/dL (ref 0.0–1.2)
CO2: 21 mmol/L (ref 20–29)
Calcium: 10.2 mg/dL (ref 8.7–10.2)
Chloride: 102 mmol/L (ref 96–106)
Creatinine, Ser: 0.89 mg/dL (ref 0.57–1.00)
Globulin, Total: 2.6 g/dL (ref 1.5–4.5)
Glucose: 136 mg/dL — ABNORMAL HIGH (ref 70–99)
Potassium: 4.7 mmol/L (ref 3.5–5.2)
Sodium: 141 mmol/L (ref 134–144)
Total Protein: 7.3 g/dL (ref 6.0–8.5)
eGFR: 77 mL/min/{1.73_m2} (ref >59)

## 2023-05-14 LAB — CBC WITH DIFFERENTIAL/PLATELET
Basophils Absolute: 0.1 10*3/uL (ref 0.0–0.2)
Basos: 1 %
EOS (ABSOLUTE): 0.1 10*3/uL (ref 0.0–0.4)
Eos: 2 %
Hematocrit: 47 % — ABNORMAL HIGH (ref 34.0–46.6)
Hemoglobin: 15.9 g/dL (ref 11.1–15.9)
Immature Grans (Abs): 0 10*3/uL (ref 0.0–0.1)
Immature Granulocytes: 0 %
Lymphocytes Absolute: 2.2 10*3/uL (ref 0.7–3.1)
Lymphs: 26 %
MCH: 31.3 pg (ref 26.6–33.0)
MCHC: 33.8 g/dL (ref 31.5–35.7)
MCV: 93 fL (ref 79–97)
Monocytes Absolute: 0.5 10*3/uL (ref 0.1–0.9)
Monocytes: 6 %
Neutrophils Absolute: 5.6 10*3/uL (ref 1.4–7.0)
Neutrophils: 65 %
Platelets: 279 10*3/uL (ref 150–450)
RBC: 5.08 x10E6/uL (ref 3.77–5.28)
RDW: 11.8 % (ref 11.7–15.4)
WBC: 8.5 10*3/uL (ref 3.4–10.8)

## 2023-05-14 LAB — LIPID PANEL
Chol/HDL Ratio: 3.4 {ratio} (ref 0.0–4.4)
Cholesterol, Total: 145 mg/dL (ref 100–199)
HDL: 43 mg/dL (ref >39)
LDL Chol Calc (NIH): 79 mg/dL (ref 0–99)
Triglycerides: 126 mg/dL (ref 0–149)
VLDL Cholesterol Cal: 23 mg/dL (ref 5–40)

## 2023-05-14 LAB — TSH: TSH: 1.55 u[IU]/mL (ref 0.450–4.500)

## 2023-05-14 LAB — VITAMIN D 25 HYDROXY (VIT D DEFICIENCY, FRACTURES): Vit D, 25-Hydroxy: 45.8 ng/mL (ref 30.0–100.0)

## 2023-05-14 LAB — HGB A1C W/O EAG: Hgb A1c MFr Bld: 6.4 % — ABNORMAL HIGH (ref 4.8–5.6)

## 2023-05-14 LAB — B12 AND FOLATE PANEL
Folate: 18.6 ng/mL (ref >3.0)
Vitamin B-12: 433 pg/mL (ref 232–1245)

## 2023-06-10 ENCOUNTER — Other Ambulatory Visit: Payer: Self-pay | Admitting: Nurse Practitioner

## 2023-06-10 DIAGNOSIS — Z760 Encounter for issue of repeat prescription: Secondary | ICD-10-CM

## 2023-09-03 DIAGNOSIS — E1165 Type 2 diabetes mellitus with hyperglycemia: Secondary | ICD-10-CM | POA: Diagnosis not present

## 2023-09-11 DIAGNOSIS — E119 Type 2 diabetes mellitus without complications: Secondary | ICD-10-CM | POA: Diagnosis not present

## 2023-09-11 DIAGNOSIS — H524 Presbyopia: Secondary | ICD-10-CM | POA: Diagnosis not present

## 2023-09-16 ENCOUNTER — Encounter: Payer: Self-pay | Admitting: Nurse Practitioner

## 2023-09-16 ENCOUNTER — Ambulatory Visit: Payer: BC Managed Care – PPO | Admitting: Nurse Practitioner

## 2023-09-16 VITALS — BP 138/88 | HR 99 | Temp 97.1°F | Resp 16 | Ht 62.0 in | Wt 225.4 lb

## 2023-09-16 DIAGNOSIS — H538 Other visual disturbances: Secondary | ICD-10-CM | POA: Diagnosis not present

## 2023-09-16 DIAGNOSIS — E785 Hyperlipidemia, unspecified: Secondary | ICD-10-CM | POA: Diagnosis not present

## 2023-09-16 DIAGNOSIS — F411 Generalized anxiety disorder: Secondary | ICD-10-CM | POA: Diagnosis not present

## 2023-09-16 DIAGNOSIS — I152 Hypertension secondary to endocrine disorders: Secondary | ICD-10-CM

## 2023-09-16 DIAGNOSIS — F410 Panic disorder [episodic paroxysmal anxiety] without agoraphobia: Secondary | ICD-10-CM

## 2023-09-16 DIAGNOSIS — E1169 Type 2 diabetes mellitus with other specified complication: Secondary | ICD-10-CM | POA: Diagnosis not present

## 2023-09-16 DIAGNOSIS — E1159 Type 2 diabetes mellitus with other circulatory complications: Secondary | ICD-10-CM

## 2023-09-16 DIAGNOSIS — E1165 Type 2 diabetes mellitus with hyperglycemia: Secondary | ICD-10-CM | POA: Diagnosis not present

## 2023-09-16 MED ORDER — MOUNJARO 5 MG/0.5ML ~~LOC~~ SOAJ
5.0000 mg | SUBCUTANEOUS | 5 refills | Status: DC
Start: 2023-09-16 — End: 2024-05-15

## 2023-09-16 MED ORDER — ALPRAZOLAM 0.25 MG PO TABS
0.2500 mg | ORAL_TABLET | Freq: Every evening | ORAL | 1 refills | Status: DC | PRN
Start: 2023-09-16 — End: 2024-05-26

## 2023-09-16 MED ORDER — ATORVASTATIN CALCIUM 10 MG PO TABS: 10.0000 mg | ORAL_TABLET | Freq: Every day | ORAL | 5 refills | Status: DC

## 2023-09-16 NOTE — Progress Notes (Signed)
[facility] [address] [place], [place] [address]  Internal MEDICINE  Office Visit Note  Patient Name: Rebecca Henry  [id number]  [id number]  Date of Service: 09/16/2023  Chief Complaint  Patient presents with   Diabetes   Hyperlipidemia   Follow-up    HPI Rebecca Henry presents for a follow-up visit for diabetes and vision changes.  Started missing some doses of mounjaro in October and November then stopped taking the medication.  Vision has been worse since restarting mounjaro, went to her eye doctor and she did not have any signs of diabetic retinopathy and he instructed her to wait a couple of months to see if this change improves. It is a side effect that should be temporary.  Has appt with [facility] today which was a referral made by urgent care when she was seen a few weeks ago. Plans to go to first appt but not sure if she will continue with them after that.    Current Medication: Outpatient Encounter Medications as of 09/16/2023  Medication Sig   acetaminophen (TYLENOL) 500 MG tablet Take 500 mg by mouth every 6 (six) hours as needed.   atenolol (TENORMIN) 25 MG tablet Take 1 tablet (25 mg total) by mouth daily.   blood glucose meter kit and supplies KIT Dispense based on patient and insurance preference. Use up to four times daily as directed. (FOR ICD-9 250.00, 250.01).   cetirizine (ZYRTEC) 5 MG tablet Take 5 mg by mouth daily.   chlorpheniramine (CHLOR-TRIMETON) 4 MG tablet Take 4 mg by mouth daily.   cyclobenzaprine (FLEXERIL) 5 MG tablet Take 1 tablet (5 mg total) by mouth 2 (two) times daily as needed for muscle spasms.   glucose blood (CONTOUR NEXT TEST) test strip Blood sugar testing testing three times daily  DX E11.65   ibuprofen (ADVIL) 200 MG tablet Take 200 mg by mouth every 6 (six) hours as needed. Takes as needed   loratadine (CLARITIN) 10 MG tablet Take 10 mg by mouth daily as needed.    meloxicam (MOBIC) 15 MG tablet Take 1 tablet (15 mg  total) by mouth daily.   [DISCONTINUED] ALPRAZolam (XANAX) 0.25 MG tablet Take 1 tablet (0.25 mg total) by mouth at bedtime as needed for anxiety (weeknights after 6pm).   [DISCONTINUED] atorvastatin (LIPITOR) 10 MG tablet TAKE 1 TABLET BY MOUTH DAILY.   [DISCONTINUED] tirzepatide [facility]) 5 MG/0.5ML Pen Inject 5 mg into the skin once a week.   ALPRAZolam (XANAX) 0.25 MG tablet Take 1 tablet (0.25 mg total) by mouth at bedtime as needed for anxiety (weeknights after 6pm).   atorvastatin (LIPITOR) 10 MG tablet Take 1 tablet (10 mg total) by mouth daily.   tirzepatide [facility]) 5 MG/0.5ML Pen Inject 5 mg into the skin once a week.   No facility-administered encounter medications on file as of 09/16/2023.    Surgical History: Past Surgical History:  Procedure Laterality Date   CESAREAN SECTION     COLONOSCOPY WITH PROPOFOL N/A 06/02/2019   Procedure: COLONOSCOPY WITH PROPOFOL;  Surgeon: Midge Minium, MD;  Location: [facility] ENDOSCOPY;  Service: Endoscopy;  Laterality: N/A;    Medical History: Past Medical History:  Diagnosis Date   Allergic rhinitis    Anxiety    Diabetes mellitus without complication (HCC)    Hyperlipidemia    Sleep apnea    Patient states she doesn't have it but it was in history. Did sleep study years ago    Family History: Family History  Problem Relation  Age of Onset   Hypertension Mother    Diabetes Maternal Grandmother    Hypothyroidism Maternal Grandmother    Breast cancer Neg Hx     Social History   Socioeconomic History   Marital status: Single    Spouse name: Not on file   Number of children: Not on file   Years of education: Not on file   Highest education level: Not on file  Occupational History   Not on file  Tobacco Use   Smoking status: Never   Smokeless tobacco: Never  Substance and Sexual Activity   Alcohol use: Not Currently   Drug use: No   Sexual activity: Not on file  Other Topics Concern   Not on file  Social History  Narrative   Not on file   Social Drivers of Health   Financial Resource Strain: Not on file  Food Insecurity: Not on file  Transportation Needs: Not on file  Physical Activity: Not on file  Stress: Not on file  Social Connections: Not on file  Intimate Partner Violence: Not on file      Review of Systems  Constitutional:  Negative for chills, fatigue and unexpected weight change.  HENT:  Negative for congestion, rhinorrhea, sneezing and sore throat.   Eyes:  Positive for visual disturbance. Negative for redness.  Respiratory: Negative.  Negative for cough, chest tightness, shortness of breath and wheezing.   Cardiovascular: Negative.  Negative for chest pain and palpitations.  Gastrointestinal:  Negative for abdominal pain, constipation, diarrhea, nausea and vomiting.  Genitourinary:  Negative for dysuria and frequency.  Musculoskeletal: Negative.  Negative for arthralgias, back pain, joint swelling and neck pain.  Skin:  Negative for rash.  Neurological: Negative.  Negative for tremors and numbness.  Hematological:  Negative for adenopathy. Does not bruise/bleed easily.  Psychiatric/Behavioral:  Negative for behavioral problems (Depression), sleep disturbance and suicidal ideas. The patient is not nervous/anxious.     Vital Signs: BP 138/88   Pulse 99   Temp (!) 97.1 F (36.2 C)   Resp 16   Ht 5\' 2"  (1.575 m)   Wt 225 lb 6.4 oz (102.2 kg)   SpO2 98%   BMI 41.23 kg/m    Physical Exam Vitals reviewed.  Constitutional:      General: She is not in acute distress.    Appearance: Normal appearance. She is obese. She is not ill-appearing.  HENT:     Head: Normocephalic and atraumatic.  Eyes:     Pupils: Pupils are equal, round, and reactive to light.  Cardiovascular:     Rate and Rhythm: Normal rate and regular rhythm.  Pulmonary:     Effort: Pulmonary effort is normal. No respiratory distress.  Neurological:     Mental Status: She is alert and oriented to person,  place, and time.  Psychiatric:        Mood and Affect: Mood normal.        Behavior: Behavior normal.        Assessment/Plan: 1. Type 2 diabetes mellitus with other specified complication, without long-term current use of insulin (HCC) (Primary) Continue mounjaro as prescribed, refills ordered  - tirzepatide (MOUNJARO) 5 MG/0.5ML Pen; Inject 5 mg into the skin once a week.  Dispense: 2 mL; Refill: 5  2. Hypertension associated with diabetes (HCC) Stable, continue atenolol as prescribed.   3. Hyperlipidemia associated with type 2 diabetes mellitus (HCC) Continue atorvastatin as prescribed, refills ordered  - atorvastatin (LIPITOR) 10 MG tablet; Take 1 tablet (  10 mg total) by mouth daily.  Dispense: 30 tablet; Refill: 5  4. Generalized anxiety disorder with panic attacks Continue prn alprazolam as prescribed.  - ALPRAZolam (XANAX) 0.25 MG tablet; Take 1 tablet (0.25 mg total) by mouth at bedtime as needed for anxiety (weeknights after 6pm).  Dispense: 30 tablet; Refill: 1   General Counseling: herminia warren understanding of the findings of todays visit and agrees with plan of treatment. I have discussed any further diagnostic evaluation that may be needed or ordered today. We also reviewed her medications today. she has been encouraged to call the office with any questions or concerns that should arise related to todays visit.    No orders of the defined types were placed in this encounter.   Meds ordered this encounter  Medications   tirzepatide (MOUNJARO) 5 MG/0.5ML Pen    Sig: Inject 5 mg into the skin once a week.    Dispense:  2 mL    Refill:  5   atorvastatin (LIPITOR) 10 MG tablet    Sig: Take 1 tablet (10 mg total) by mouth daily.    Dispense:  30 tablet    Refill:  5   ALPRAZolam (XANAX) 0.25 MG tablet    Sig: Take 1 tablet (0.25 mg total) by mouth at bedtime as needed for anxiety (weeknights after 6pm).    Dispense:  30 tablet    Refill:  1    Return in  about 4 months (around 01/14/2024) for F/U, Recheck A1C, Jaclene Bartelt PCP.   Total time spent:30 Minutes Time spent includes review of chart, medications, test results, and follow up plan with the patient.   [place] Controlled Substance Database was reviewed by me.  This patient was seen by Sallyanne Kuster, FNP-C in collaboration with Dr. Beverely Risen as a part of collaborative care agreement.   Alonzo Loving R. Tedd Sias, MSN, FNP-C Internal medicine

## 2023-09-19 DIAGNOSIS — H538 Other visual disturbances: Secondary | ICD-10-CM | POA: Diagnosis not present

## 2023-09-19 DIAGNOSIS — Z0100 Encounter for examination of eyes and vision without abnormal findings: Secondary | ICD-10-CM | POA: Diagnosis not present

## 2023-10-10 DIAGNOSIS — Z0100 Encounter for examination of eyes and vision without abnormal findings: Secondary | ICD-10-CM | POA: Diagnosis not present

## 2023-10-20 ENCOUNTER — Other Ambulatory Visit: Payer: Self-pay | Admitting: Nurse Practitioner

## 2023-10-20 DIAGNOSIS — M545 Low back pain, unspecified: Secondary | ICD-10-CM

## 2023-10-22 ENCOUNTER — Other Ambulatory Visit: Payer: Self-pay | Admitting: Nurse Practitioner

## 2023-10-22 DIAGNOSIS — Z760 Encounter for issue of repeat prescription: Secondary | ICD-10-CM

## 2023-12-23 DIAGNOSIS — E1159 Type 2 diabetes mellitus with other circulatory complications: Secondary | ICD-10-CM | POA: Diagnosis not present

## 2023-12-23 DIAGNOSIS — E1169 Type 2 diabetes mellitus with other specified complication: Secondary | ICD-10-CM | POA: Diagnosis not present

## 2023-12-23 DIAGNOSIS — E1165 Type 2 diabetes mellitus with hyperglycemia: Secondary | ICD-10-CM | POA: Diagnosis not present

## 2023-12-23 DIAGNOSIS — E669 Obesity, unspecified: Secondary | ICD-10-CM | POA: Diagnosis not present

## 2023-12-25 ENCOUNTER — Other Ambulatory Visit: Payer: Self-pay

## 2024-01-20 ENCOUNTER — Ambulatory Visit: Payer: BC Managed Care – PPO | Admitting: Nurse Practitioner

## 2024-03-17 ENCOUNTER — Other Ambulatory Visit: Payer: Self-pay | Admitting: Nurse Practitioner

## 2024-03-17 DIAGNOSIS — E1169 Type 2 diabetes mellitus with other specified complication: Secondary | ICD-10-CM

## 2024-04-24 ENCOUNTER — Other Ambulatory Visit: Payer: Self-pay | Admitting: Nurse Practitioner

## 2024-04-24 DIAGNOSIS — I10 Essential (primary) hypertension: Secondary | ICD-10-CM

## 2024-05-14 ENCOUNTER — Other Ambulatory Visit: Payer: Self-pay | Admitting: Nurse Practitioner

## 2024-05-14 DIAGNOSIS — M545 Low back pain, unspecified: Secondary | ICD-10-CM

## 2024-05-14 DIAGNOSIS — E1169 Type 2 diabetes mellitus with other specified complication: Secondary | ICD-10-CM

## 2024-05-18 ENCOUNTER — Encounter: Payer: BC Managed Care – PPO | Admitting: Nurse Practitioner

## 2024-05-18 ENCOUNTER — Telehealth: Payer: Self-pay

## 2024-05-18 DIAGNOSIS — E119 Type 2 diabetes mellitus without complications: Secondary | ICD-10-CM | POA: Insufficient documentation

## 2024-05-18 DIAGNOSIS — I152 Hypertension secondary to endocrine disorders: Secondary | ICD-10-CM

## 2024-05-18 DIAGNOSIS — Z6841 Body Mass Index (BMI) 40.0 and over, adult: Secondary | ICD-10-CM

## 2024-05-18 DIAGNOSIS — E1169 Type 2 diabetes mellitus with other specified complication: Secondary | ICD-10-CM

## 2024-05-18 DIAGNOSIS — E538 Deficiency of other specified B group vitamins: Secondary | ICD-10-CM

## 2024-05-18 DIAGNOSIS — E559 Vitamin D deficiency, unspecified: Secondary | ICD-10-CM

## 2024-05-19 NOTE — Telephone Encounter (Signed)
 Done

## 2024-05-21 DIAGNOSIS — E1159 Type 2 diabetes mellitus with other circulatory complications: Secondary | ICD-10-CM | POA: Diagnosis not present

## 2024-05-21 DIAGNOSIS — E1169 Type 2 diabetes mellitus with other specified complication: Secondary | ICD-10-CM | POA: Diagnosis not present

## 2024-05-21 DIAGNOSIS — I152 Hypertension secondary to endocrine disorders: Secondary | ICD-10-CM | POA: Diagnosis not present

## 2024-05-21 DIAGNOSIS — E559 Vitamin D deficiency, unspecified: Secondary | ICD-10-CM | POA: Diagnosis not present

## 2024-05-21 DIAGNOSIS — E785 Hyperlipidemia, unspecified: Secondary | ICD-10-CM | POA: Diagnosis not present

## 2024-05-21 DIAGNOSIS — E538 Deficiency of other specified B group vitamins: Secondary | ICD-10-CM | POA: Diagnosis not present

## 2024-05-21 DIAGNOSIS — Z6841 Body Mass Index (BMI) 40.0 and over, adult: Secondary | ICD-10-CM | POA: Diagnosis not present

## 2024-05-22 LAB — LIPID PANEL
Chol/HDL Ratio: 3.9 ratio (ref 0.0–4.4)
Cholesterol, Total: 142 mg/dL (ref 100–199)
HDL: 36 mg/dL — ABNORMAL LOW (ref >39)
LDL Chol Calc (NIH): 71 mg/dL (ref 0–99)
Triglycerides: 208 mg/dL — ABNORMAL HIGH (ref 0–149)
VLDL Cholesterol Cal: 35 mg/dL (ref 5–40)

## 2024-05-22 LAB — CBC WITH DIFFERENTIAL/PLATELET
Basophils Absolute: 0.1 x10E3/uL (ref 0.0–0.2)
Basos: 1 %
EOS (ABSOLUTE): 0.2 x10E3/uL (ref 0.0–0.4)
Eos: 3 %
Hematocrit: 42.4 % (ref 34.0–46.6)
Hemoglobin: 14.5 g/dL (ref 11.1–15.9)
Immature Grans (Abs): 0 x10E3/uL (ref 0.0–0.1)
Immature Granulocytes: 0 %
Lymphocytes Absolute: 2.2 x10E3/uL (ref 0.7–3.1)
Lymphs: 39 %
MCH: 31.7 pg (ref 26.6–33.0)
MCHC: 34.2 g/dL (ref 31.5–35.7)
MCV: 93 fL (ref 79–97)
Monocytes Absolute: 0.4 x10E3/uL (ref 0.1–0.9)
Monocytes: 6 %
Neutrophils Absolute: 2.8 x10E3/uL (ref 1.4–7.0)
Neutrophils: 51 %
Platelets: 254 x10E3/uL (ref 150–450)
RBC: 4.57 x10E6/uL (ref 3.77–5.28)
RDW: 11.8 % (ref 11.7–15.4)
WBC: 5.6 x10E3/uL (ref 3.4–10.8)

## 2024-05-22 LAB — CMP14+EGFR
ALT: 18 IU/L (ref 0–32)
AST: 13 IU/L (ref 0–40)
Albumin: 4.3 g/dL (ref 3.8–4.9)
Alkaline Phosphatase: 105 IU/L (ref 49–135)
BUN/Creatinine Ratio: 11 (ref 9–23)
BUN: 9 mg/dL (ref 6–24)
Bilirubin Total: 0.4 mg/dL (ref 0.0–1.2)
CO2: 20 mmol/L (ref 20–29)
Calcium: 9.5 mg/dL (ref 8.7–10.2)
Chloride: 104 mmol/L (ref 96–106)
Creatinine, Ser: 0.79 mg/dL (ref 0.57–1.00)
Globulin, Total: 2.6 g/dL (ref 1.5–4.5)
Glucose: 146 mg/dL — ABNORMAL HIGH (ref 70–99)
Potassium: 4.7 mmol/L (ref 3.5–5.2)
Sodium: 140 mmol/L (ref 134–144)
Total Protein: 6.9 g/dL (ref 6.0–8.5)
eGFR: 88 mL/min/1.73 (ref >59)

## 2024-05-22 LAB — B12 AND FOLATE PANEL
Folate: 9.9 ng/mL (ref >3.0)
Vitamin B-12: 310 pg/mL (ref 232–1245)

## 2024-05-22 LAB — HGB A1C W/O EAG: Hgb A1c MFr Bld: 6 % — ABNORMAL HIGH (ref 4.8–5.6)

## 2024-05-22 LAB — VITAMIN D 25 HYDROXY (VIT D DEFICIENCY, FRACTURES): Vit D, 25-Hydroxy: 29.3 ng/mL — ABNORMAL LOW (ref 30.0–100.0)

## 2024-05-26 ENCOUNTER — Encounter: Payer: Self-pay | Admitting: Nurse Practitioner

## 2024-05-26 ENCOUNTER — Ambulatory Visit (INDEPENDENT_AMBULATORY_CARE_PROVIDER_SITE_OTHER): Admitting: Nurse Practitioner

## 2024-05-26 VITALS — BP 134/86 | HR 85 | Temp 96.7°F | Resp 16 | Ht 62.0 in | Wt 224.6 lb

## 2024-05-26 DIAGNOSIS — F410 Panic disorder [episodic paroxysmal anxiety] without agoraphobia: Secondary | ICD-10-CM

## 2024-05-26 DIAGNOSIS — E1169 Type 2 diabetes mellitus with other specified complication: Secondary | ICD-10-CM | POA: Diagnosis not present

## 2024-05-26 DIAGNOSIS — Z1231 Encounter for screening mammogram for malignant neoplasm of breast: Secondary | ICD-10-CM

## 2024-05-26 DIAGNOSIS — M545 Low back pain, unspecified: Secondary | ICD-10-CM | POA: Diagnosis not present

## 2024-05-26 DIAGNOSIS — Z0001 Encounter for general adult medical examination with abnormal findings: Secondary | ICD-10-CM

## 2024-05-26 DIAGNOSIS — F411 Generalized anxiety disorder: Secondary | ICD-10-CM | POA: Diagnosis not present

## 2024-05-26 DIAGNOSIS — E785 Hyperlipidemia, unspecified: Secondary | ICD-10-CM

## 2024-05-26 DIAGNOSIS — G8929 Other chronic pain: Secondary | ICD-10-CM

## 2024-05-26 MED ORDER — ALPRAZOLAM 0.25 MG PO TABS: 0.2500 mg | ORAL_TABLET | Freq: Every evening | ORAL | 0 refills | Status: AC | PRN

## 2024-05-26 NOTE — Progress Notes (Signed)
 [facility] [address] [place], [place] [address]  Internal MEDICINE  Office Visit Note  Patient Name: Rebecca Henry  [id number]  [id number]  Date of Service: 05/26/2024  Chief Complaint  Patient presents with   Diabetes   Hyperlipidemia   Annual Exam    HPI Rebecca Henry presents for an annual well visit and physical exam.  Well-appearing 58 y.o. female with diabetes, hypertension, osteoarthritis, high cholesterol, and GAD.  Routine CRC screening: due in 2030 Routine mammogram: due now  DEXA scan: not due yet.  Pap smear: due now, will plan to do at a follow up visit.  Eye exam: done in march  foot exam: done  Labs: recent lab results discussed with patient  New or worsening pain: none  Other concerns: none   The 10-year ASCVD risk score (Arnett DK, et al., 2019) is: 5.8%   Values used to calculate the score:     Age: 34 years     Clincally relevant sex: Female     Is Non-Hispanic African American: No     Diabetic: Yes     Tobacco smoker: No     Systolic Blood Pressure: 134 mmHg     Is BP treated: Yes     HDL Cholesterol: 36 mg/dL     Total Cholesterol: 142 mg/dL   Fibrosis 4 Score = .66 (Low risk)      Interpretation for patients with NAFLD          <1.30       -  F0-F1 (Low risk)          1.30-2.67 -  Indeterminate           >2.67      -  F3-F4 (High risk)     Validated for ages 64-65           Current Medication: Outpatient Encounter Medications as of 05/26/2024  Medication Sig   acetaminophen (TYLENOL) 500 MG tablet Take 500 mg by mouth every 6 (six) hours as needed.   ALPRAZolam  (XANAX ) 0.25 MG tablet Take 1 tablet (0.25 mg total) by mouth at bedtime as needed for anxiety (weeknights after 6pm).   atenolol  (TENORMIN ) 25 MG tablet TAKE 1 TABLET(25 MG) BY MOUTH DAILY   atorvastatin  (LIPITOR) 10 MG tablet TAKE 1 TABLET(10 MG) BY MOUTH DAILY   blood glucose meter kit and supplies KIT Dispense based on patient and insurance preference. Use up to  four times daily as directed. (FOR ICD-9 250.00, 250.01).   cetirizine (ZYRTEC) 5 MG tablet Take 5 mg by mouth daily.   chlorpheniramine (CHLOR-TRIMETON) 4 MG tablet Take 4 mg by mouth daily.   cyclobenzaprine  (FLEXERIL ) 5 MG tablet TAKE 1 TABLET BY MOUTH TWICE DAILY AS NEEDED FOR MUSCLE SPASMS   glucose blood (CONTOUR NEXT TEST) test strip USE TO TEST BLOOD SUGAR THREE TIMES DAILY   ibuprofen  (ADVIL ) 200 MG tablet Take 200 mg by mouth every 6 (six) hours as needed. Takes as needed   loratadine (CLARITIN) 10 MG tablet Take 10 mg by mouth daily as needed.    meloxicam  (MOBIC ) 15 MG tablet TAKE 1 TABLET(15 MG) BY MOUTH DAILY   MOUNJARO  5 MG/0.5ML Pen ADMINISTER 5 MG UNDER THE SKIN 1 TIME A WEEK   [DISCONTINUED] ALPRAZolam  (XANAX ) 0.25 MG tablet Take 1 tablet (0.25 mg total) by mouth at bedtime as needed for anxiety (weeknights after 6pm).   No facility-administered encounter medications on file as of 05/26/2024.    Surgical History: Past  Surgical History:  Procedure Laterality Date   CESAREAN SECTION     COLONOSCOPY WITH PROPOFOL  N/A 06/02/2019   Procedure: COLONOSCOPY WITH PROPOFOL ;  Surgeon: Jinny Carmine, MD;  Location: [facility] ENDOSCOPY;  Service: Endoscopy;  Laterality: N/A;    Medical History: Past Medical History:  Diagnosis Date   Allergic rhinitis    Anxiety    Diabetes mellitus without complication (HCC)    Hyperlipidemia    Sleep apnea    Patient states she doesn't have it but it was in history. Did sleep study years ago    Family History: Family History  Problem Relation Age of Onset   Hypertension Mother    Diabetes Maternal Grandmother    Hypothyroidism Maternal Grandmother    Breast cancer Neg Hx     Social History   Socioeconomic History   Marital status: Single    Spouse name: Not on file   Number of children: Not on file   Years of education: Not on file   Highest education level: Not on file  Occupational History   Not on file  Tobacco Use   Smoking  status: Never   Smokeless tobacco: Never  Substance and Sexual Activity   Alcohol use: Not Currently   Drug use: No   Sexual activity: Not on file  Other Topics Concern   Not on file  Social History Narrative   Not on file   Social Drivers of Health   Financial Resource Strain: Not on file  Food Insecurity: Not on file  Transportation Needs: Not on file  Physical Activity: Not on file  Stress: Not on file  Social Connections: Not on file  Intimate Partner Violence: Not on file      Review of Systems  Constitutional:  Negative for activity change, appetite change, chills, fatigue, fever and unexpected weight change.  HENT: Negative.  Negative for congestion, ear pain, rhinorrhea, sore throat and trouble swallowing.   Eyes: Negative.   Respiratory: Negative.  Negative for cough, chest tightness, shortness of breath and wheezing.   Cardiovascular: Negative.  Negative for chest pain.  Gastrointestinal: Negative.  Negative for abdominal pain, blood in stool, constipation, diarrhea, nausea and vomiting.  Endocrine: Negative.   Genitourinary: Negative.  Negative for difficulty urinating, dysuria, frequency, hematuria and urgency.  Musculoskeletal: Negative.  Negative for arthralgias, back pain, joint swelling, myalgias and neck pain.  Skin: Negative.  Negative for rash and wound.  Allergic/Immunologic: Negative.  Negative for immunocompromised state.  Neurological: Negative.  Negative for dizziness, seizures, numbness and headaches.  Hematological: Negative.   Psychiatric/Behavioral: Negative.  Negative for behavioral problems, self-injury and suicidal ideas. The patient is not nervous/anxious.     Vital Signs: BP 134/86   Pulse 85   Temp (!) 96.7 F (35.9 C)   Resp 16   Ht 5' 2 (1.575 m)   Wt 224 lb 9.6 oz (101.9 kg)   SpO2 99%   BMI 41.08 kg/m    Physical Exam Vitals reviewed.  Constitutional:      General: She is not in acute distress.    Appearance: Normal  appearance. She is well-developed. She is obese. She is not ill-appearing or diaphoretic.  HENT:     Head: Normocephalic and atraumatic.     Right Ear: Tympanic membrane, ear canal and external ear normal.     Left Ear: Tympanic membrane, ear canal and external ear normal.     Nose: Nose normal. No congestion or rhinorrhea.     Mouth/Throat:  Mouth: Mucous membranes are moist.     Pharynx: Oropharynx is clear. No oropharyngeal exudate or posterior oropharyngeal erythema.  Eyes:     General: No scleral icterus.       Right eye: No discharge.        Left eye: No discharge.     Extraocular Movements: Extraocular movements intact.     Conjunctiva/sclera: Conjunctivae normal.     Pupils: Pupils are equal, round, and reactive to light.  Neck:     Thyroid : No thyromegaly.     Vascular: No JVD.     Trachea: No tracheal deviation.  Cardiovascular:     Rate and Rhythm: Normal rate and regular rhythm.     Pulses: Normal pulses.          Dorsalis pedis pulses are 2+ on the right side and 2+ on the left side.       Posterior tibial pulses are 2+ on the right side and 2+ on the left side.     Heart sounds: Normal heart sounds. No murmur heard.    No friction rub. No gallop.  Pulmonary:     Effort: Pulmonary effort is normal. No respiratory distress.     Breath sounds: Normal breath sounds. No stridor. No wheezing or rales.  Chest:     Chest wall: No tenderness.  Abdominal:     General: Bowel sounds are normal. There is no distension.     Palpations: Abdomen is soft. There is no mass.     Tenderness: There is no abdominal tenderness. There is no guarding or rebound.  Musculoskeletal:        General: No tenderness or deformity. Normal range of motion.     Cervical back: Normal range of motion and neck supple.     Right foot: Normal range of motion. No deformity, bunion, Charcot foot, foot drop or prominent metatarsal heads.     Left foot: Normal range of motion. No deformity, bunion,  Charcot foot, foot drop or prominent metatarsal heads.  Feet:     Right foot:     Protective Sensation: 6 sites tested.  6 sites sensed.     Skin integrity: Dry skin present. No ulcer, blister, skin breakdown, erythema, warmth, callus or fissure.     Toenail Condition: Right toenails are abnormally thick and long.     Left foot:     Protective Sensation: 6 sites tested.  6 sites sensed.     Skin integrity: Dry skin present. No ulcer, blister, skin breakdown, erythema, warmth, callus or fissure.     Toenail Condition: Left toenails are abnormally thick and long.  Lymphadenopathy:     Cervical: No cervical adenopathy.  Skin:    General: Skin is warm and dry.     Capillary Refill: Capillary refill takes less than 2 seconds.     Coloration: Skin is not pale.     Findings: No erythema or rash.  Neurological:     Mental Status: She is alert and oriented to person, place, and time.     Cranial Nerves: No cranial nerve deficit.     Motor: No abnormal muscle tone.     Coordination: Coordination normal.     Gait: Gait normal.     Deep Tendon Reflexes: Reflexes are normal and symmetric.  Psychiatric:        Mood and Affect: Mood normal.        Behavior: Behavior normal.        Thought Content: Thought content  normal.        Judgment: Judgment normal.        Assessment/Plan: 1. Encounter for routine adult health examination with abnormal findings (Primary) Age-appropriate preventive screenings and vaccinations discussed, annual physical exam completed. Routine labs for health maintenance results discussed with the patient today. PHM updated.    2. Type 2 diabetes mellitus with other specified complication, without long-term current use of insulin (HCC) Urine sent for microalbumin/creatinine ratio. Her A1c is well controlled on mounjaro .  - Urine Microalbumin w/creat. ratio  3. Hyperlipidemia associated with type 2 diabetes mellitus (HCC) Continue atorvastatin  as prescribed.   4.  Chronic midline low back pain without sciatica Continue OTC NSAIDS as needed and cyclobenzaprine  as prescribed.   5. Encounter for screening mammogram for malignant neoplasm of breast Routine screening mammogram ordered  - MM 3D SCREENING MAMMOGRAM BILATERAL BREAST; Future  6. Generalized anxiety disorder with panic attacks Continue prn alprazolam  as prescribed. Follow up as needed for refills.  - ALPRAZolam  (XANAX ) 0.25 MG tablet; Take 1 tablet (0.25 mg total) by mouth at bedtime as needed for anxiety (weeknights after 6pm).  Dispense: 30 tablet; Refill: 0      General Counseling: Rebecca Henry understanding of the findings of todays visit and agrees with plan of treatment. I have discussed any further diagnostic evaluation that may be needed or ordered today. We also reviewed her medications today. she has been encouraged to call the office with any questions or concerns that should arise related to todays visit.    Orders Placed This Encounter  Procedures   MM 3D SCREENING MAMMOGRAM BILATERAL BREAST   Urine Microalbumin w/creat. ratio    Meds ordered this encounter  Medications   ALPRAZolam  (XANAX ) 0.25 MG tablet    Sig: Take 1 tablet (0.25 mg total) by mouth at bedtime as needed for anxiety (weeknights after 6pm).    Dispense:  30 tablet    Refill:  0    Return in about 4 months (around 09/26/2024) for F/U, Recheck A1C, Fairley Copher PCP and need PAP smear in december .   Total time spent:30 Minutes Time spent includes review of chart, medications, test results, and follow up plan with the patient.   [place] Controlled Substance Database was reviewed by me.  This patient was seen by Mardy Maxin, FNP-C in collaboration with Dr. Sigrid Bathe as a part of collaborative care agreement.  Vern Prestia R. Maxin, MSN, FNP-C Internal medicine

## 2024-05-27 LAB — MICROALBUMIN / CREATININE URINE RATIO
Creatinine, Urine: 265 mg/dL
Microalb/Creat Ratio: 15 mg/g{creat} (ref 0–29)
Microalbumin, Urine: 39.5 ug/mL

## 2024-06-18 ENCOUNTER — Encounter: Payer: Self-pay | Admitting: Nurse Practitioner

## 2024-07-06 ENCOUNTER — Ambulatory Visit: Admitting: Nurse Practitioner

## 2024-08-13 ENCOUNTER — Other Ambulatory Visit: Payer: Self-pay | Admitting: Nurse Practitioner

## 2024-08-13 DIAGNOSIS — E1169 Type 2 diabetes mellitus with other specified complication: Secondary | ICD-10-CM

## 2024-09-24 ENCOUNTER — Ambulatory Visit: Admitting: Nurse Practitioner

## 2025-05-27 ENCOUNTER — Encounter: Admitting: Nurse Practitioner
